# Patient Record
Sex: Male | Born: 1960 | Race: White | Hispanic: No | Marital: Married | Smoking: Never smoker
Health system: Southern US, Community
[De-identification: ages and names within clinical notes are randomized; demographics above are authoritative.]

## PROBLEM LIST (undated history)

## (undated) DIAGNOSIS — J302 Other seasonal allergic rhinitis: Secondary | ICD-10-CM

## (undated) DIAGNOSIS — E785 Hyperlipidemia, unspecified: Secondary | ICD-10-CM

## (undated) HISTORY — DX: Hyperlipidemia, unspecified: E78.5

## (undated) HISTORY — PX: SINUS EXPLORATION: SHX5214

## (undated) HISTORY — PX: TONSILLECTOMY: SUR1361

## (undated) HISTORY — DX: Other seasonal allergic rhinitis: J30.2

## (undated) HISTORY — PX: OTHER SURGICAL HISTORY: SHX169

---

## 1997-10-19 HISTORY — PX: VASECTOMY: SHX75

## 1999-11-23 ENCOUNTER — Ambulatory Visit (HOSPITAL_COMMUNITY): Admission: RE | Admit: 1999-11-23 | Discharge: 1999-11-23 | Payer: Self-pay | Admitting: *Deleted

## 1999-11-23 ENCOUNTER — Encounter: Payer: Self-pay | Admitting: *Deleted

## 1999-11-27 ENCOUNTER — Encounter: Payer: Self-pay | Admitting: *Deleted

## 1999-11-27 ENCOUNTER — Ambulatory Visit (HOSPITAL_COMMUNITY): Admission: RE | Admit: 1999-11-27 | Discharge: 1999-11-27 | Payer: Self-pay | Admitting: *Deleted

## 2010-05-20 ENCOUNTER — Encounter: Admission: RE | Admit: 2010-05-20 | Discharge: 2010-07-17 | Payer: Self-pay | Admitting: Specialist

## 2011-01-07 ENCOUNTER — Encounter: Payer: Self-pay | Admitting: Family Medicine

## 2011-01-07 DIAGNOSIS — E785 Hyperlipidemia, unspecified: Secondary | ICD-10-CM

## 2011-01-07 DIAGNOSIS — E782 Mixed hyperlipidemia: Secondary | ICD-10-CM | POA: Insufficient documentation

## 2011-01-07 DIAGNOSIS — J302 Other seasonal allergic rhinitis: Secondary | ICD-10-CM

## 2013-02-17 ENCOUNTER — Other Ambulatory Visit: Payer: Self-pay

## 2013-03-01 ENCOUNTER — Other Ambulatory Visit (INDEPENDENT_AMBULATORY_CARE_PROVIDER_SITE_OTHER): Payer: 59

## 2013-03-01 DIAGNOSIS — Z0000 Encounter for general adult medical examination without abnormal findings: Secondary | ICD-10-CM

## 2013-03-01 DIAGNOSIS — E785 Hyperlipidemia, unspecified: Secondary | ICD-10-CM

## 2013-03-01 LAB — BASIC METABOLIC PANEL
BUN: 12 mg/dL (ref 6–23)
CO2: 30 mEq/L (ref 19–32)
Calcium: 10.2 mg/dL (ref 8.4–10.5)
Chloride: 105 mEq/L (ref 96–112)
Creat: 0.93 mg/dL (ref 0.50–1.35)
Glucose, Bld: 108 mg/dL — ABNORMAL HIGH (ref 70–99)
Potassium: 5.2 mEq/L (ref 3.5–5.3)
Sodium: 141 mEq/L (ref 135–145)

## 2013-03-01 LAB — POCT CBC
Granulocyte percent: 63.4 %G (ref 37–80)
HCT, POC: 48.4 % (ref 43.5–53.7)
Hemoglobin: 16.5 g/dL (ref 14.1–18.1)
Lymph, poc: 2.5 (ref 0.6–3.4)
MCH, POC: 31.3 pg — AB (ref 27–31.2)
MCHC: 34.1 g/dL (ref 31.8–35.4)
MCV: 91.8 fL (ref 80–97)
MPV: 8.3 fL (ref 0–99.8)
POC Granulocyte: 4.6 (ref 2–6.9)
POC LYMPH PERCENT: 34.5 %L (ref 10–50)
Platelet Count, POC: 194 10*3/uL (ref 142–424)
RBC: 5.3 M/uL (ref 4.69–6.13)
RDW, POC: 12.5 %
WBC: 7.3 10*3/uL (ref 4.6–10.2)

## 2013-03-01 LAB — HEPATIC FUNCTION PANEL
ALT: 31 U/L (ref 0–53)
AST: 22 U/L (ref 0–37)
Albumin: 4.9 g/dL (ref 3.5–5.2)
Alkaline Phosphatase: 108 U/L (ref 39–117)
Bilirubin, Direct: 0.1 mg/dL (ref 0.0–0.3)
Indirect Bilirubin: 0.6 mg/dL (ref 0.0–0.9)
Total Bilirubin: 0.7 mg/dL (ref 0.3–1.2)
Total Protein: 7.5 g/dL (ref 6.0–8.3)

## 2013-03-01 NOTE — Progress Notes (Signed)
Patient here today for labs only. °

## 2013-03-02 LAB — NMR LIPOPROFILE WITH LIPIDS
Cholesterol, Total: 150 mg/dL (ref <200)
HDL Particle Number: 33.9 umol/L (ref >=30.5)
HDL Size: 8.7 nm — ABNORMAL LOW (ref >=9.2)
HDL-C: 39 mg/dL — ABNORMAL LOW (ref >=40)
LDL (calc): 77 mg/dL (ref <100)
LDL Particle Number: 937 nmol/L (ref <1000)
LDL Size: 20 nm — ABNORMAL LOW (ref >20.5)
LP-IR Score: 67 — ABNORMAL HIGH (ref <=45)
Large HDL-P: 3.2 umol/L — ABNORMAL LOW (ref >=4.8)
Large VLDL-P: 4.4 nmol/L — ABNORMAL HIGH (ref <=2.7)
Small LDL Particle Number: 648 nmol/L — ABNORMAL HIGH (ref <=527)
Triglycerides: 168 mg/dL — ABNORMAL HIGH (ref <150)
VLDL Size: 47.6 nm — ABNORMAL HIGH (ref <=46.6)

## 2013-03-02 LAB — VITAMIN D 25 HYDROXY (VIT D DEFICIENCY, FRACTURES): Vit D, 25-Hydroxy: 36 ng/mL (ref 30–89)

## 2013-03-06 ENCOUNTER — Encounter: Payer: Self-pay | Admitting: Family Medicine

## 2013-03-06 ENCOUNTER — Ambulatory Visit (INDEPENDENT_AMBULATORY_CARE_PROVIDER_SITE_OTHER): Payer: 59 | Admitting: Family Medicine

## 2013-03-06 VITALS — BP 111/75 | HR 67 | Temp 97.8°F | Ht 68.0 in | Wt 175.2 lb

## 2013-03-06 DIAGNOSIS — R739 Hyperglycemia, unspecified: Secondary | ICD-10-CM

## 2013-03-06 DIAGNOSIS — R7309 Other abnormal glucose: Secondary | ICD-10-CM

## 2013-03-06 DIAGNOSIS — N400 Enlarged prostate without lower urinary tract symptoms: Secondary | ICD-10-CM

## 2013-03-06 DIAGNOSIS — J309 Allergic rhinitis, unspecified: Secondary | ICD-10-CM

## 2013-03-06 DIAGNOSIS — Z0000 Encounter for general adult medical examination without abnormal findings: Secondary | ICD-10-CM

## 2013-03-06 LAB — POCT GLYCOSYLATED HEMOGLOBIN (HGB A1C): Hemoglobin A1C: 5.4

## 2013-03-06 NOTE — Patient Instructions (Addendum)
Continue more aggressive therapeutic lifestyle changes. This includes weight loss exercise and diet. Check insurance and see if you qualify for a shingles shot or zostavax, they may wait until 52 years old If you continue to have problems with nasal congestion, you may want to try the Nasacort AQ over-the-counter 1-2 sprays each nostril at bedtime Clean the area on your back with alcohol that we did cryotherapy on

## 2013-03-06 NOTE — Progress Notes (Addendum)
  Subjective:    Patient ID: Darrell Schneider, male    DOB: 09/11/1961, 52 y.o.   MRN: [id number]  HPI See present review of systems. Recent labs were reviewed with patient. Blood sugar slightly elevated at 108 hemoglobin A1c was done. A PSA was also drawn today. The main reason for the visit today is his annual physical exam.    Review of Systems  Constitutional: Negative.   HENT: Positive for sneezing and postnasal drip (due to allergies).   Eyes: Negative.   Respiratory: Negative.   Cardiovascular: Negative.   Gastrointestinal: Negative.   Endocrine: Negative.   Genitourinary: Negative.   Musculoskeletal: Positive for back pain (LBP) and arthralgias (R knee, bilateral shoulders and elbows).  Allergic/Immunologic: Positive for environmental allergies (seasonal).  Neurological: Positive for headaches (occasional).  Hematological: Negative.   Psychiatric/Behavioral: Negative.        Objective:   Physical Exam BP 111/75  Pulse 67  Temp(Src) 97.8 F (36.6 C) (Oral)  Ht 5\' 8"  (1.727 m)  Wt 175 lb 3.2 oz (79.47 kg)  BMI 26.65 kg/m2  The patient appeared well nourished and normally developed, alert and oriented to time and place. Speech, behavior and judgement appear normal. Vital signs as documented.  Head exam is unremarkable. No scleral icterus or pallor noted. Nasal congestion bilateral. Throat was normal. Neck is without jugular venous distension, thyromegally, or carotid bruits. Carotid upstrokes are brisk bilaterally. No cervical adenopathy. Lungs are clear anteriorly and posteriorly to auscultation. Normal respiratory effort. Cardiac exam reveals regular rate and rhythm at 72 per minute. First and second heart sounds normal.  No murmurs, rubs or gallops.  Abdominal exam reveals normal bowl sounds, no masses, no organomegaly and no aortic enlargement. No inguinal adenopathy. Rectal exam revealed no rectal masses. Testicular exam was normal. There were no hernias. The prostate  was smooth slightly enlarged without any lumps. Extremities are nonedematous and both femoral and pedal pulses are normal. Skin without pallor or jaundice.  Warm and dry, without rash. Neurologic exam reveals normal deep tendon reflexes and normal sensation.          Assessment & Plan:  1. Elevated blood sugar - POCT glycosylated hemoglobin (Hb A1C)  2. Allergic rhinitis  3. BPH (benign prostatic hyperplasia) - PSA  4.AK - right back/cryotherapy  5. Annual physical exam  Patient Instructions  Continue more aggressive therapeutic lifestyle changes. This includes weight loss exercise and diet. Check insurance and see if you qualify for a shingles shot or zostavax, they may wait until 52 years old If you continue to have problems with nasal congestion, you may want to try the Nasacort AQ over-the-counter 1-2 sprays each nostril at bedtime Clean the area on your back with alcohol that we did cryotherapy on

## 2013-03-08 ENCOUNTER — Telehealth: Payer: Self-pay | Admitting: *Deleted

## 2013-03-08 NOTE — Telephone Encounter (Signed)
Message copied by Bearl Mulberry on Wed Mar 08, 2013  6:02 PM ------      Message from: Ernestina Penna      Created: Tue Mar 07, 2013  7:23 AM       PSA within normal limit ------

## 2013-03-08 NOTE — Telephone Encounter (Signed)
Pt's wife notified of result 

## 2013-03-21 NOTE — Addendum Note (Signed)
Addended by: Ernestina Penna on: 03/21/2013 02:21 PM   Modules accepted: Level of Service

## 2013-03-23 ENCOUNTER — Other Ambulatory Visit (INDEPENDENT_AMBULATORY_CARE_PROVIDER_SITE_OTHER): Payer: 59

## 2013-03-23 DIAGNOSIS — Z1212 Encounter for screening for malignant neoplasm of rectum: Secondary | ICD-10-CM

## 2013-04-06 ENCOUNTER — Other Ambulatory Visit: Payer: Self-pay | Admitting: *Deleted

## 2013-04-06 MED ORDER — ATORVASTATIN CALCIUM 80 MG PO TABS: 80.0000 mg | ORAL_TABLET | Freq: Every day | ORAL | Status: DC

## 2013-04-10 ENCOUNTER — Other Ambulatory Visit: Payer: Self-pay | Admitting: Family Medicine

## 2013-05-15 ENCOUNTER — Other Ambulatory Visit: Payer: Self-pay | Admitting: Family Medicine

## 2013-05-16 NOTE — Telephone Encounter (Signed)
I don't see this on med list or where we have ever ordered [place]

## 2013-05-16 NOTE — Telephone Encounter (Signed)
Please call and confirm this with him Also call the drug store and find out if my name is on this medication

## 2013-05-30 ENCOUNTER — Telehealth: Payer: Self-pay | Admitting: Family Medicine

## 2013-06-05 MED ORDER — ZOLPIDEM TARTRATE 10 MG PO TABS: 10.0000 mg | ORAL_TABLET | Freq: Every evening | ORAL | Status: DC | PRN

## 2013-06-05 NOTE — Telephone Encounter (Signed)
I called Darrell Schneider and he had never filled there before, but Darrell Schneider states that he had gotten Ambien 10 from Darrell Schneider in [place] in the past.  [facility] reports last filling Ambien 10mg  #30 on 05/13/11.

## 2013-06-05 NOTE — Telephone Encounter (Signed)
Being addressed in another encounter

## 2013-06-06 NOTE — Telephone Encounter (Signed)
Spoke with pt regarding Ambien refill He wanted refill called into [facility] called in

## 2013-09-06 ENCOUNTER — Ambulatory Visit (INDEPENDENT_AMBULATORY_CARE_PROVIDER_SITE_OTHER): Payer: 59 | Admitting: Family Medicine

## 2013-09-06 ENCOUNTER — Encounter: Payer: Self-pay | Admitting: Family Medicine

## 2013-09-06 VITALS — BP 119/76 | HR 76 | Temp 96.9°F | Ht 68.0 in | Wt 176.0 lb

## 2013-09-06 DIAGNOSIS — M25512 Pain in left shoulder: Secondary | ICD-10-CM

## 2013-09-06 DIAGNOSIS — E785 Hyperlipidemia, unspecified: Secondary | ICD-10-CM

## 2013-09-06 DIAGNOSIS — E559 Vitamin D deficiency, unspecified: Secondary | ICD-10-CM

## 2013-09-06 DIAGNOSIS — G4700 Insomnia, unspecified: Secondary | ICD-10-CM | POA: Insufficient documentation

## 2013-09-06 DIAGNOSIS — M25519 Pain in unspecified shoulder: Secondary | ICD-10-CM

## 2013-09-06 DIAGNOSIS — J309 Allergic rhinitis, unspecified: Secondary | ICD-10-CM

## 2013-09-06 DIAGNOSIS — J302 Other seasonal allergic rhinitis: Secondary | ICD-10-CM

## 2013-09-06 LAB — POCT CBC
Granulocyte percent: 62.5 %G (ref 37–80)
HCT, POC: 48.5 % (ref 43.5–53.7)
Lymph, poc: 2.6 (ref 0.6–3.4)
MCV: 92.4 fL (ref 80–97)
POC Granulocyte: 4.6 (ref 2–6.9)
Platelet Count, POC: 218 10*3/uL (ref 142–424)
RBC: 5.3 M/uL (ref 4.69–6.13)

## 2013-09-06 MED ORDER — MELOXICAM 15 MG PO TABS: ORAL_TABLET | ORAL | Status: DC

## 2013-09-06 NOTE — Patient Instructions (Addendum)
Continue current medications. Continue good therapeutic lifestyle changes which include good diet and exercise. Fall precautions discussed with patient If you are over 52 years old - you may need Prevnar 13 or the adult Pneumonia vaccine. Be sure to schedule an eye exam Take today's prescribed medication as directed after your largest meal Be sure to check on your insurance for coverage for a Prevnar injection, also check and see if insurance coverage will be provided for a shingles shot Use warm wet compresses to shoulder 20 minutes 3 or 4 times daily

## 2013-09-06 NOTE — Progress Notes (Signed)
Subjective:    Patient ID: Darrell Schneider, male    DOB: Sep 26, 1961, 52 y.o.   MRN: [id number]  HPI Pt here for follow up and management of chronic medical problems. Patient complains of left shoulder pain for a couple of weeks. He does not recall any history of injury to the shoulder. He does have a history of a poor and rotator cuff, right shoulder which has not been operated on. The patient has not had an eye exam he was 52 years old. Health maintenance-wise he is due to get a Prevnar shot.     Patient Active Problem List   Diagnosis Date Noted  . Seasonal allergies 01/07/2011  . Other and unspecified hyperlipidemia    Outpatient Encounter Prescriptions as of 09/06/2013  Medication Sig  . aspirin EC 81 MG tablet Take 81 mg by mouth daily.  Marland Kitchen atorvastatin (LIPITOR) 80 MG tablet Take 1 tablet (80 mg total) by mouth daily.  . Cholecalciferol (VITAMIN D3) 1000 UNITS CAPS Take 1 tablet by mouth 1 dose over 46 hours.    Marland Kitchen zolpidem (AMBIEN) 10 MG tablet Take 1 tablet (10 mg total) by mouth at bedtime as needed for sleep.    Review of Systems  Constitutional: Negative.   HENT: Negative.   Eyes: Negative.   Respiratory: Negative.   Cardiovascular: Negative.   Gastrointestinal: Negative.   Endocrine: Negative.   Genitourinary: Negative.   Musculoskeletal: Positive for arthralgias (left shoulder pain).  Skin: Negative.   Allergic/Immunologic: Negative.   Neurological: Negative.   Hematological: Negative.   Psychiatric/Behavioral: Negative.        Objective:   Physical Exam  Nursing note and vitals reviewed. Constitutional: He is oriented to person, place, and time. He appears well-developed and well-nourished.  HENT:  Head: Normocephalic and atraumatic.  Right Ear: External ear normal.  Left Ear: External ear normal.  Mouth/Throat: Oropharynx is clear and moist. No oropharyngeal exudate.  Nasal congestion bilaterally  Eyes: Conjunctivae and EOM are normal. Pupils are equal,  round, and reactive to light. Right eye exhibits no discharge. Left eye exhibits no discharge. No scleral icterus.  Neck: Normal range of motion. Neck supple. No tracheal deviation present. No thyromegaly present.  No carotid bruits  Cardiovascular: Normal rate, regular rhythm, normal heart sounds and intact distal pulses.  Exam reveals no gallop and no friction rub.   No murmur heard. At 72 per minute  Pulmonary/Chest: Effort normal and breath sounds normal. No respiratory distress. He has no wheezes. He has no rales. He exhibits no tenderness.  Abdominal: Soft. Bowel sounds are normal. He exhibits no mass. There is no tenderness. There is no rebound and no guarding.  Musculoskeletal: Normal range of motion. He exhibits no edema and no tenderness.  Lymphadenopathy:    He has no cervical adenopathy.  Neurological: He is alert and oriented to person, place, and time. He has normal reflexes. No cranial nerve deficit.  Skin: Skin is warm and dry. No rash noted. No erythema. No pallor.  Psychiatric: He has a normal mood and affect. His behavior is normal. Judgment and thought content normal.   BP 119/76  Pulse 76  Temp(Src) 96.9 F (36.1 C) (Oral)  Ht 5\' 8"  (1.727 m)  Wt 176 lb (79.833 kg)  BMI 26.77 kg/m2        Assessment & Plan:   1. Other and unspecified hyperlipidemia   2. Seasonal allergies   3. Vitamin D deficiency   4. Insomnia   5. Left anterior shoulder  pain    Orders Placed This Encounter  Procedures  . Hepatic function panel  . BMP8+EGFR  . NMR, lipoprofile  . Vit D  25 hydroxy (rtn osteoporosis monitoring)  . POCT CBC   Meds ordered this encounter  Medications  . meloxicam (MOBIC) 15 MG tablet    Sig: One half to one daily after eating    Dispense:  30 tablet    Refill:  0   Patient Instructions  Continue current medications. Continue good therapeutic lifestyle changes which include good diet and exercise. Fall precautions discussed with patient If you  are over 53 years old - you may need Prevnar 13 or the adult Pneumonia vaccine. Be sure to schedule an eye exam Take today's prescribed medication as directed after your largest meal Be sure to check on your insurance for coverage for a Prevnar injection, also check and see if insurance coverage will be provided for a shingles shot Use warm wet compresses to shoulder 20 minutes 3 or 4 times daily   Nyra Capes MD

## 2013-09-08 LAB — HEPATIC FUNCTION PANEL
AST: 22 IU/L (ref 0–40)
Albumin: 4.6 g/dL (ref 3.5–5.5)
Total Bilirubin: 0.7 mg/dL (ref 0.0–1.2)
Total Protein: 7.1 g/dL (ref 6.0–8.5)

## 2013-09-08 LAB — NMR, LIPOPROFILE
Cholesterol: 156 mg/dL (ref <200)
HDL Cholesterol by NMR: 43 mg/dL (ref >=40)
LDL Particle Number: 1250 nmol/L — ABNORMAL HIGH (ref <1000)
LDLC SERPL CALC-MCNC: 68 mg/dL (ref <100)
LP-IR Score: 81 — ABNORMAL HIGH (ref <=45)

## 2013-09-08 LAB — BMP8+EGFR
BUN/Creatinine Ratio: 11 (ref 9–20)
CO2: 28 mmol/L (ref 18–29)
Creatinine, Ser: 0.92 mg/dL (ref 0.76–1.27)
GFR calc Af Amer: 110 mL/min/{1.73_m2} (ref >59)
GFR calc non Af Amer: 95 mL/min/{1.73_m2} (ref >59)
Sodium: 140 mmol/L (ref 134–144)

## 2013-09-08 LAB — VITAMIN D 25 HYDROXY (VIT D DEFICIENCY, FRACTURES): Vit D, 25-Hydroxy: 35.2 ng/mL (ref 30.0–100.0)

## 2013-09-20 ENCOUNTER — Telehealth: Payer: Self-pay | Admitting: *Deleted

## 2013-09-20 NOTE — Telephone Encounter (Signed)
Message copied by Baltazar Apo on Wed Sep 20, 2013 11:17 AM ------      Message from: Ernestina Penna      Created: Fri Sep 08, 2013  3:16 PM       LFTs within normal limits      Blood sugar is slightly elevated at 101 this should be less than 100. Kidney function and electrolytes including potassium are within normal limits      On advanced lipid testing a total LDL particle number, which is the most important number, is elevated at 1250. 6 months ago it was 937. This number should be less than 1000. Triglycerides are very elevated at 224. The should be less than 100 ideally. Elevated triglycerides are associated with diet habits that are not healthy---------- try to do better. The HDL particle number or the good cholesterol is in a good range.------ continue current treatment but do better with therapeutic lifestyle changes, which include diet and exercise      Vitamin D is 35.2, increase D. 3 x 1000 daily. This number ideally should be above 50. You can buy the "Now" brand at the drugstore in [place] or at [facility]. ------

## 2013-09-20 NOTE — Telephone Encounter (Signed)
Lm to call back

## 2013-10-02 NOTE — Telephone Encounter (Signed)
Wife notified and verbalized understanding.  

## 2013-12-03 HISTORY — PX: ROTATOR CUFF REPAIR: SHX139

## 2014-05-15 ENCOUNTER — Telehealth: Payer: Self-pay | Admitting: Family Medicine

## 2014-05-16 MED ORDER — ATORVASTATIN CALCIUM 80 MG PO TABS: 80.0000 mg | ORAL_TABLET | Freq: Every day | ORAL | Status: DC

## 2014-05-16 NOTE — Telephone Encounter (Signed)
done

## 2014-10-03 ENCOUNTER — Ambulatory Visit (INDEPENDENT_AMBULATORY_CARE_PROVIDER_SITE_OTHER): Payer: 59 | Admitting: Family Medicine

## 2014-10-03 ENCOUNTER — Encounter: Payer: Self-pay | Admitting: Family Medicine

## 2014-10-03 VITALS — BP 119/78 | HR 68 | Temp 97.6°F | Ht 68.0 in | Wt 178.0 lb

## 2014-10-03 DIAGNOSIS — E559 Vitamin D deficiency, unspecified: Secondary | ICD-10-CM

## 2014-10-03 DIAGNOSIS — Z0000 Encounter for general adult medical examination without abnormal findings: Secondary | ICD-10-CM

## 2014-10-03 DIAGNOSIS — E785 Hyperlipidemia, unspecified: Secondary | ICD-10-CM

## 2014-10-03 DIAGNOSIS — N400 Enlarged prostate without lower urinary tract symptoms: Secondary | ICD-10-CM

## 2014-10-03 DIAGNOSIS — R7309 Other abnormal glucose: Secondary | ICD-10-CM

## 2014-10-03 DIAGNOSIS — R739 Hyperglycemia, unspecified: Secondary | ICD-10-CM

## 2014-10-03 LAB — POCT URINALYSIS DIPSTICK
Bilirubin, UA: NEGATIVE
Glucose, UA: NEGATIVE
KETONES UA: NEGATIVE
LEUKOCYTES UA: NEGATIVE
Nitrite, UA: NEGATIVE
PH UA: 6.5
Protein, UA: NEGATIVE
RBC UA: NEGATIVE
Spec Grav, UA: 1.01
Urobilinogen, UA: NEGATIVE

## 2014-10-03 LAB — POCT UA - MICROSCOPIC ONLY
Bacteria, U Microscopic: NEGATIVE
CRYSTALS, UR, HPF, POC: NEGATIVE
Casts, Ur, LPF, POC: NEGATIVE
MUCUS UA: NEGATIVE
RBC, URINE, MICROSCOPIC: NEGATIVE
WBC, UR, HPF, POC: NEGATIVE
Yeast, UA: NEGATIVE

## 2014-10-03 LAB — POCT CBC
GRANULOCYTE PERCENT: 64 % (ref 37–80)
HEMATOCRIT: 48.1 % (ref 43.5–53.7)
HEMOGLOBIN: 16.1 g/dL (ref 14.1–18.1)
Lymph, poc: 2.3 (ref 0.6–3.4)
MCH, POC: 31.3 pg — AB (ref 27–31.2)
MCHC: 33.4 g/dL (ref 31.8–35.4)
MCV: 93.6 fL (ref 80–97)
MPV: 8.1 fL (ref 0–99.8)
POC Granulocyte: 4.3 (ref 2–6.9)
POC LYMPH PERCENT: 34.1 %L (ref 10–50)
Platelet Count, POC: 225 10*3/uL (ref 142–424)
RBC: 5.1 M/uL (ref 4.69–6.13)
RDW, POC: 12.4 %
WBC: 6.7 10*3/uL (ref 4.6–10.2)

## 2014-10-03 MED ORDER — ATORVASTATIN CALCIUM 80 MG PO TABS: 80.0000 mg | ORAL_TABLET | Freq: Every day | ORAL | Status: DC

## 2014-10-03 MED ORDER — ZOLPIDEM TARTRATE 10 MG PO TABS: 10.0000 mg | ORAL_TABLET | Freq: Every evening | ORAL | Status: DC | PRN

## 2014-10-03 NOTE — Progress Notes (Signed)
Subjective:    Patient ID: Darrell Schneider, male    DOB: 1961-06-01, 53 y.o.   MRN: [id number]  HPI Patient is here today for annual wellness exam and follow up of chronic medical problems. The patient is doing well with no specific complaints. He is due to get a PSA today lab work today and a urinalysis today. He is requesting refills on his atorvastatin and Ambien.         Patient Active Problem List   Diagnosis Date Noted  . Insomnia 09/06/2013  . Seasonal allergies 01/07/2011  . Hyperlipidemia    Outpatient Encounter Prescriptions as of 10/03/2014  Medication Sig  . aspirin EC 81 MG tablet Take 81 mg by mouth daily.  Marland Kitchen atorvastatin (LIPITOR) 80 MG tablet Take 1 tablet (80 mg total) by mouth daily.  . Cholecalciferol (VITAMIN D3) 1000 UNITS CAPS Take 1 tablet by mouth 1 dose over 46 hours.    Marland Kitchen zolpidem (AMBIEN) 10 MG tablet Take 1 tablet (10 mg total) by mouth at bedtime as needed for sleep.  . fluticasone (FLONASE) 50 MCG/ACT nasal spray   . [DISCONTINUED] meloxicam (MOBIC) 15 MG tablet One half to one daily after eating    Review of Systems  Constitutional: Negative.   HENT: Negative.   Eyes: Negative.   Respiratory: Negative.   Cardiovascular: Negative.   Gastrointestinal: Negative.   Endocrine: Negative.   Genitourinary: Negative.   Musculoskeletal: Negative.   Skin: Negative.   Allergic/Immunologic: Negative.   Neurological: Negative.   Hematological: Negative.   Psychiatric/Behavioral: Negative.        Objective:   Physical Exam  Constitutional: He is oriented to person, place, and time. He appears well-developed and well-nourished.  HENT:  Head: Normocephalic and atraumatic.  Right Ear: External ear normal.  Left Ear: External ear normal.  Mouth/Throat: Oropharynx is clear and moist. No oropharyngeal exudate.  Nasal congestion and turbinate swelling bilaterally  Eyes: Conjunctivae and EOM are normal. Pupils are equal, round, and reactive to light. Right  eye exhibits no discharge. Left eye exhibits no discharge. No scleral icterus.  Neck: Normal range of motion. Neck supple. No thyromegaly present.  No anterior cervical adenopathy or carotid bruits  Cardiovascular: Normal rate, regular rhythm, normal heart sounds and intact distal pulses.  Exam reveals no gallop and no friction rub.   No murmur heard. At 72/m  Pulmonary/Chest: Effort normal and breath sounds normal. No respiratory distress. He has no wheezes. He has no rales. He exhibits no tenderness.  No axillary adenopathy and no chest Mccaughey masses  Abdominal: Soft. Bowel sounds are normal. He exhibits no mass. There is no tenderness. There is no rebound and no guarding.  Genitourinary: Rectum normal and penis normal.  The prostate is slightly enlarged without lumps or masses. The rectal exam had no masses. The external genitalia were normal. There was no inguinal hernia and there were no inguinal nodes.  Musculoskeletal: Normal range of motion. He exhibits no edema or tenderness.  Lymphadenopathy:    He has no cervical adenopathy.  Neurological: He is alert and oriented to person, place, and time. He has normal reflexes. No cranial nerve deficit.  Skin: Skin is warm and dry. No rash noted. No erythema. No pallor.  Psychiatric: He has a normal mood and affect. His behavior is normal. Judgment and thought content normal.  Nursing note and vitals reviewed.  BP 119/78 mmHg  Pulse 68  Temp(Src) 97.6 F (36.4 C) (Oral)  Ht _0  (1.727 m)  Wt 178 lb (80.74 kg)  BMI 27.07 kg/m2  EKG: Normal EKG       Assessment & Plan:  1. Vitamin D deficiency - POCT CBC - Vit D  25 hydroxy (rtn osteoporosis monitoring)  2. Elevated blood sugar - POCT CBC - BMP8+EGFR  3. Annual physical exam - POCT CBC - POCT UA - Microscopic Only - POCT urinalysis dipstick - BMP8+EGFR - Hepatic function panel - PSA, total and free - NMR, lipoprofile - Vit D  25 hydroxy (rtn osteoporosis monitoring) -  Thyroid Panel With TSH - EKG 12-Lead  4. BPH (benign prostatic hyperplasia) - POCT CBC - POCT UA - Microscopic Only - POCT urinalysis dipstick - PSA, total and free  5. Hyperlipidemia - EKG 12-Lead  Patient Instructions  Continue current medications. Continue good therapeutic lifestyle changes which include good diet and exercise. Fall precautions discussed with patient. If an FOBT was given today- please return it to our front desk. If you are over 10 years old - you may need Prevnar 47 or the adult Pneumonia vaccine.  Flu Shots will be available at our office starting mid- September. Please call and schedule a FLU CLINIC APPOINTMENT.   We will call you with your lab work results once those results are available Please check with your insurance regarding the Prevnar vaccine. We will refill your atorvastatin and Ambien. Continue to watch sodium intake Return the FOBT   Arrie Senate MD

## 2014-10-03 NOTE — Patient Instructions (Addendum)
Continue current medications. Continue good therapeutic lifestyle changes which include good diet and exercise. Fall precautions discussed with patient. If an FOBT was given today- please return it to our front desk. If you are over 53 years old - you may need Prevnar 13 or the adult Pneumonia vaccine.  Flu Shots will be available at our office starting mid- September. Please call and schedule a FLU CLINIC APPOINTMENT.   We will call you with your lab work results once those results are available Please check with your insurance regarding the Prevnar vaccine. We will refill your atorvastatin and Ambien. Continue to watch sodium intake Return the FOBT

## 2014-10-04 ENCOUNTER — Telehealth: Payer: Self-pay | Admitting: Family Medicine

## 2014-10-04 LAB — NMR, LIPOPROFILE
Cholesterol: 176 mg/dL (ref 100–199)
HDL Cholesterol by NMR: 46 mg/dL (ref >39)
HDL PARTICLE NUMBER: 36 umol/L (ref >=30.5)
LDL Particle Number: 1448 nmol/L — ABNORMAL HIGH (ref <1000)
LDL Size: 20.1 nm (ref >20.5)
LDL-C: 96 mg/dL (ref 0–99)
LP-IR Score: 71 — ABNORMAL HIGH (ref <=45)
SMALL LDL PARTICLE NUMBER: 864 nmol/L — AB (ref <=527)
Triglycerides by NMR: 170 mg/dL — ABNORMAL HIGH (ref 0–149)

## 2014-10-04 LAB — PSA, TOTAL AND FREE
PSA FREE: 0.25 ng/mL
PSA, Free Pct: 35.7 %
PSA: 0.7 ng/mL (ref 0.0–4.0)

## 2014-10-04 LAB — BMP8+EGFR
BUN/Creatinine Ratio: 14 (ref 9–20)
BUN: 14 mg/dL (ref 6–24)
CALCIUM: 10.3 mg/dL — AB (ref 8.7–10.2)
CO2: 24 mmol/L (ref 18–29)
Chloride: 102 mmol/L (ref 97–108)
Creatinine, Ser: 0.98 mg/dL (ref 0.76–1.27)
GFR calc Af Amer: 101 mL/min/{1.73_m2} (ref >59)
GFR, EST NON AFRICAN AMERICAN: 88 mL/min/{1.73_m2} (ref >59)
Glucose: 112 mg/dL — ABNORMAL HIGH (ref 65–99)
Potassium: 5.6 mmol/L — ABNORMAL HIGH (ref 3.5–5.2)
SODIUM: 143 mmol/L (ref 134–144)

## 2014-10-04 LAB — THYROID PANEL WITH TSH
FREE THYROXINE INDEX: 2.3 (ref 1.2–4.9)
T3 Uptake Ratio: 28 % (ref 24–39)
T4, Total: 8.2 ug/dL (ref 4.5–12.0)
TSH: 3.82 u[IU]/mL (ref 0.450–4.500)

## 2014-10-04 LAB — HEPATIC FUNCTION PANEL
ALT: 37 IU/L (ref 0–44)
AST: 29 IU/L (ref 0–40)
Albumin: 4.9 g/dL (ref 3.5–5.5)
Alkaline Phosphatase: 124 IU/L — ABNORMAL HIGH (ref 39–117)
Bilirubin, Direct: 0.15 mg/dL (ref 0.00–0.40)
TOTAL PROTEIN: 7.6 g/dL (ref 6.0–8.5)
Total Bilirubin: 0.5 mg/dL (ref 0.0–1.2)

## 2014-10-04 LAB — VITAMIN D 25 HYDROXY (VIT D DEFICIENCY, FRACTURES): Vit D, 25-Hydroxy: 53.4 ng/mL (ref 30.0–100.0)

## 2014-10-04 NOTE — Telephone Encounter (Signed)
-----   Message from Donald W Moore, MD sent at 10/04/2014  8:28 AM EST ----- The blood sugar slightly elevated at 112. The creatinine the most important kidney function test is within normal limits. The electrolytes including potassium have a potassium that is slightly elevated at 5.6 and a serum calcium that is slightly elevated at 10.3.----- please have this patient recheck a BMP in 2 weeks nonfasting. 1 liver function test is slightly elevated and we will continue to monitor this. The PSA is low and within normal limits. Cholesterol numbers with advanced lipid testing are higher than in the past. The LDL particle numbers now 1448. The LDL C is good at 96. The triglycerides remain elevated above 150 at 170.------ when the patient comes by for his repeat BMP please schedule him for a visit with the clinical pharmacist to discuss better cholesterol management++++++ The vitamin D level is good at 53.4, continue current treatment All thyroid function tests are within normal limits 

## 2014-10-08 NOTE — Telephone Encounter (Signed)
Pt aware of results and need for possible pharmacy appointmnent

## 2014-10-08 NOTE — Telephone Encounter (Signed)
-----   Message from Ernestina Pennaonald W Moore, MD sent at 10/04/2014  8:28 AM EST ----- The blood sugar slightly elevated at 112. The creatinine the most important kidney function test is within normal limits. The electrolytes including potassium have a potassium that is slightly elevated at 5.6 and a serum calcium that is slightly elevated at 10.3.----- please have this patient recheck a BMP in 2 weeks nonfasting. 1 liver function test is slightly elevated and we will continue to monitor this. The PSA is low and within normal limits. Cholesterol numbers with advanced lipid testing are higher than in the past. The LDL particle numbers now 1448. The LDL C is good at 96. The triglycerides remain elevated above 150 at 170.------ when the patient comes by for his repeat BMP please schedule him for a visit with the clinical pharmacist to discuss better cholesterol management++++++ The vitamin D level is good at 53.4, continue current treatment All thyroid function tests are within normal limits

## 2014-12-24 ENCOUNTER — Other Ambulatory Visit: Payer: Self-pay | Admitting: Family Medicine

## 2014-12-24 NOTE — Telephone Encounter (Signed)
Called wife

## 2015-05-31 ENCOUNTER — Other Ambulatory Visit: Payer: Self-pay

## 2015-05-31 MED ORDER — ZOLPIDEM TARTRATE 10 MG PO TABS: 10.0000 mg | ORAL_TABLET | Freq: Every evening | ORAL | Status: DC | PRN

## 2015-05-31 NOTE — Telephone Encounter (Signed)
Last seen 10/03/14  DWM  This is for mail order  If approved print

## 2015-10-08 ENCOUNTER — Ambulatory Visit: Payer: 59 | Admitting: Family Medicine

## 2015-11-19 ENCOUNTER — Other Ambulatory Visit: Payer: Self-pay | Admitting: Family Medicine

## 2016-03-04 ENCOUNTER — Other Ambulatory Visit: Payer: 59

## 2016-03-04 DIAGNOSIS — R739 Hyperglycemia, unspecified: Secondary | ICD-10-CM

## 2016-03-04 DIAGNOSIS — N400 Enlarged prostate without lower urinary tract symptoms: Secondary | ICD-10-CM

## 2016-03-04 DIAGNOSIS — Z0000 Encounter for general adult medical examination without abnormal findings: Secondary | ICD-10-CM

## 2016-03-04 DIAGNOSIS — E559 Vitamin D deficiency, unspecified: Secondary | ICD-10-CM

## 2016-03-04 DIAGNOSIS — E785 Hyperlipidemia, unspecified: Secondary | ICD-10-CM

## 2016-03-05 LAB — BMP8+EGFR
BUN/Creatinine Ratio: 14 (ref 9–20)
BUN: 13 mg/dL (ref 6–24)
CO2: 25 mmol/L (ref 18–29)
CREATININE: 0.96 mg/dL (ref 0.76–1.27)
Calcium: 9.7 mg/dL (ref 8.7–10.2)
Chloride: 99 mmol/L (ref 96–106)
GFR calc Af Amer: 103 mL/min/{1.73_m2} (ref >59)
GFR calc non Af Amer: 89 mL/min/{1.73_m2} (ref >59)
Glucose: 93 mg/dL (ref 65–99)
Potassium: 4.4 mmol/L (ref 3.5–5.2)
Sodium: 139 mmol/L (ref 134–144)

## 2016-03-05 LAB — CBC WITH DIFFERENTIAL/PLATELET
BASOS: 1 %
Basophils Absolute: 0 10*3/uL (ref 0.0–0.2)
EOS (ABSOLUTE): 0.1 10*3/uL (ref 0.0–0.4)
Eos: 2 %
HEMOGLOBIN: 16 g/dL (ref 12.6–17.7)
Hematocrit: 45.9 % (ref 37.5–51.0)
IMMATURE GRANS (ABS): 0 10*3/uL (ref 0.0–0.1)
Immature Granulocytes: 0 %
Lymphocytes Absolute: 2.7 10*3/uL (ref 0.7–3.1)
Lymphs: 46 %
MCH: 31.7 pg (ref 26.6–33.0)
MCHC: 34.9 g/dL (ref 31.5–35.7)
MCV: 91 fL (ref 79–97)
Monocytes Absolute: 0.4 10*3/uL (ref 0.1–0.9)
Monocytes: 7 %
NEUTROS ABS: 2.6 10*3/uL (ref 1.4–7.0)
NEUTROS PCT: 44 %
Platelets: 234 10*3/uL (ref 150–379)
RBC: 5.05 x10E6/uL (ref 4.14–5.80)
RDW: 13.4 % (ref 12.3–15.4)
WBC: 5.9 10*3/uL (ref 3.4–10.8)

## 2016-03-05 LAB — NMR, LIPOPROFILE
CHOLESTEROL: 167 mg/dL (ref 100–199)
HDL Cholesterol by NMR: 46 mg/dL (ref >39)
HDL PARTICLE NUMBER: 31.6 umol/L (ref >=30.5)
LDL PARTICLE NUMBER: 1366 nmol/L — AB (ref <1000)
LDL SIZE: 20.1 nm (ref >20.5)
LDL-C: 96 mg/dL (ref 0–99)
LP-IR SCORE: 67 — AB (ref <=45)
Small LDL Particle Number: 820 nmol/L — ABNORMAL HIGH (ref <=527)
TRIGLYCERIDES BY NMR: 125 mg/dL (ref 0–149)

## 2016-03-05 LAB — VITAMIN D 25 HYDROXY (VIT D DEFICIENCY, FRACTURES): Vit D, 25-Hydroxy: 45 ng/mL (ref 30.0–100.0)

## 2016-03-05 LAB — PSA, TOTAL AND FREE
PSA FREE PCT: 32.9 %
PSA, Free: 0.23 ng/mL
Prostate Specific Ag, Serum: 0.7 ng/mL (ref 0.0–4.0)

## 2016-03-05 LAB — HEPATIC FUNCTION PANEL
ALBUMIN: 4.7 g/dL (ref 3.5–5.5)
ALT: 30 IU/L (ref 0–44)
AST: 28 IU/L (ref 0–40)
Alkaline Phosphatase: 117 IU/L (ref 39–117)
BILIRUBIN TOTAL: 0.8 mg/dL (ref 0.0–1.2)
Bilirubin, Direct: 0.19 mg/dL (ref 0.00–0.40)
Total Protein: 7 g/dL (ref 6.0–8.5)

## 2016-03-09 ENCOUNTER — Ambulatory Visit (INDEPENDENT_AMBULATORY_CARE_PROVIDER_SITE_OTHER): Payer: 59

## 2016-03-09 ENCOUNTER — Ambulatory Visit (INDEPENDENT_AMBULATORY_CARE_PROVIDER_SITE_OTHER): Payer: 59 | Admitting: Family Medicine

## 2016-03-09 ENCOUNTER — Encounter: Payer: Self-pay | Admitting: Family Medicine

## 2016-03-09 ENCOUNTER — Ambulatory Visit: Payer: 59

## 2016-03-09 VITALS — BP 120/82 | HR 74 | Temp 98.2°F | Ht 68.0 in | Wt 181.8 lb

## 2016-03-09 DIAGNOSIS — M25562 Pain in left knee: Secondary | ICD-10-CM | POA: Diagnosis not present

## 2016-03-09 DIAGNOSIS — E785 Hyperlipidemia, unspecified: Secondary | ICD-10-CM

## 2016-03-09 DIAGNOSIS — Z0000 Encounter for general adult medical examination without abnormal findings: Secondary | ICD-10-CM

## 2016-03-09 DIAGNOSIS — N5201 Erectile dysfunction due to arterial insufficiency: Secondary | ICD-10-CM

## 2016-03-09 DIAGNOSIS — G4700 Insomnia, unspecified: Secondary | ICD-10-CM

## 2016-03-09 DIAGNOSIS — Z1211 Encounter for screening for malignant neoplasm of colon: Secondary | ICD-10-CM

## 2016-03-09 DIAGNOSIS — N400 Enlarged prostate without lower urinary tract symptoms: Secondary | ICD-10-CM

## 2016-03-09 MED ORDER — SUVOREXANT 20 MG PO TABS: 20.0000 mg | ORAL_TABLET | Freq: Every evening | ORAL | Status: DC | PRN

## 2016-03-09 MED ORDER — SILDENAFIL CITRATE 20 MG PO TABS: ORAL_TABLET | ORAL | Status: DC

## 2016-03-09 MED ORDER — SUVOREXANT 15 MG PO TABS: 15.0000 mg | ORAL_TABLET | Freq: Every evening | ORAL | Status: DC | PRN

## 2016-03-09 MED ORDER — ZOLPIDEM TARTRATE 10 MG PO TABS: 10.0000 mg | ORAL_TABLET | Freq: Every evening | ORAL | Status: DC | PRN

## 2016-03-09 MED ORDER — SUVOREXANT 10 MG PO TABS: 10.0000 mg | ORAL_TABLET | Freq: Every evening | ORAL | Status: DC | PRN

## 2016-03-09 NOTE — Patient Instructions (Signed)
Take Aleve, over-the-counter, 1 twice daily after breakfast and supper. In order not to irritate her stomach you may want to pick up some ranitidine, the equate brand and take one twice daily before breakfast and supper while staying on the Aleve. He should take the Aleve for at least 3-4 weeks to see if this helps inflammation in the knee. If you continue to have problems she should get back in touch with us as we may need to do an injection in the knee or get further x-rays. Increase the atorvastatin to 80 mg on Monday Wednesday and Friday and 40 on all other days Recheck liver function test and cholesterol and 6 months. Don't forget to get your eye exam Continue to watch her diet closely and get as much exercise as possible Take the sildenafil or generic Viagra as directed Take the Belsomra as directed and increase the dose to review findings which dose works best for you and then call us back and we will call in a prescription for this. We will also check and see when you need to have your next colonoscopy, but it is most likely in 10 years which would put you in September 2022. Return the FOBT or the color guard.

## 2016-03-09 NOTE — Progress Notes (Signed)
Subjective:    Patient ID: Darrell Schneider, male    DOB: October 10, 1961, 55 y.o.   MRN: [id number]  HPI  Patient is here today for a complete physical. Patient has had left knee pain for the last 3-4 weeks. It is important to note that he is no longer taking Ambien. He is taking atorvastatin. He is using Flonase for allergic rhinitis. The patient does not recall any specific injury to his knee. He has been playing racquetball since the first the year and did not notice the medial joint lineof the left knee hurting until sometime in April. It has restricted his activity somewhat. He says he has periodic swelling and pain. He takes some ibuprofen and this relieves the discomfort. He denies any chest pain or shortness of breath. He has no trouble swallowing nausea vomiting diarrhea or blood in the stool. He does have occasional heartburn. He's passing his water without problems but does indicate that he has trouble maintaining his erections. He has not seen an eye doctor in over 7 years. He is not sure when he is supposed to get a follow-up colonoscopy and we will check on this for him.  Review of Systems  Constitutional: Negative.   HENT: Negative.   Eyes: Negative.   Respiratory: Negative.   Cardiovascular: Negative.   Gastrointestinal: Negative.   Endocrine: Negative.   Genitourinary: Negative.   Musculoskeletal:       Left knee pain.   Skin: Negative.   Allergic/Immunologic: Negative.   Neurological: Negative.   Hematological: Negative.   Psychiatric/Behavioral: Negative.         Patient Active Problem List   Diagnosis Date Noted  . Insomnia 09/06/2013  . Seasonal allergies 01/07/2011  . Hyperlipidemia    Outpatient Encounter Prescriptions as of 03/09/2016  Medication Sig  . aspirin EC 81 MG tablet Take 81 mg by mouth daily.  Marland Kitchen. atorvastatin (LIPITOR) 80 MG tablet TAKE 1 TABLET (80 MG TOTAL) BY MOUTH DAILY.  Marland Kitchen. Cholecalciferol (VITAMIN D3) 1000 UNITS CAPS Take 1 tablet by mouth 1 dose  over 46 hours.    . fluticasone (FLONASE) 50 MCG/ACT nasal spray   . zolpidem (AMBIEN) 10 MG tablet Take 1 tablet (10 mg total) by mouth at bedtime as needed for sleep.  . [DISCONTINUED] zolpidem (AMBIEN) 10 MG tablet Take 1 tablet (10 mg total) by mouth at bedtime as needed for sleep.   No facility-administered encounter medications on file as of 03/09/2016.       Objective:   Physical Exam  Constitutional: He is oriented to person, place, and time. He appears well-developed and well-nourished. No distress.  HENT:  Head: Normocephalic and atraumatic.  Right Ear: External ear normal.  Left Ear: External ear normal.  Mouth/Throat: Oropharynx is clear and moist. No oropharyngeal exudate.  Nasal congestion and turbinate swelling bilaterally  Eyes: Conjunctivae and EOM are normal. Pupils are equal, round, and reactive to light. Right eye exhibits no discharge. Left eye exhibits no discharge. No scleral icterus.  Neck: Normal range of motion. Neck supple. No thyromegaly present.  No bruits thyromegaly or anterior cervical adenopathy  Cardiovascular: Normal rate, regular rhythm, normal heart sounds and intact distal pulses.   No murmur heard. The heart has a regular rate and rhythm at 72/m  Pulmonary/Chest: Effort normal and breath sounds normal. No respiratory distress. He has no wheezes. He has no rales. He exhibits no tenderness.  No axillary adenopathy and chest was clear anteriorly and posteriorly  Abdominal: Soft. Bowel sounds  are normal. He exhibits no mass. There is no tenderness. There is no rebound and no guarding.  No liver or spleen abnormality no bruits no masses and no inguinal adenopathy  Genitourinary: Rectum normal and penis normal.  The prostate was slightly enlarged but soft and smooth.  Musculoskeletal: Normal range of motion. He exhibits no edema or tenderness.  The left knee had good mobility with flexion and extension and there was minimal if any joint line tenderness  on the medial joint line of this left knee.  Lymphadenopathy:    He has no cervical adenopathy.  Neurological: He is alert and oriented to person, place, and time. He has normal reflexes. No cranial nerve deficit.  Skin: Skin is warm and dry. No rash noted.  Psychiatric: He has a normal mood and affect. His behavior is normal. Judgment and thought content normal.  Nursing note and vitals reviewed.   BP 120/82 mmHg  Pulse 74  Temp(Src) 98.2 F (36.8 C) (Oral)  Ht 5\' 8"  (1.727 m)  Wt 181 lb 12.8 oz (82.464 kg)  BMI 27.65 kg/m2  [facility] reading (PRIMARY) by  Dr. Shawn Stall knee, results pending                                       Assessment & Plan:  1. Special screening for malignant neoplasms, colon - Fecal occult blood, imunochemical; Future  2. Left knee pain -Take Aleve twice daily for 3-4 weeks after breakfast and supper and take ranitidine prior to breakfast and supper to protect stomach from any side effects with the Aleve - DG Knee 1-2 Views Left; Future  3. Hyperlipidemia -The patient will increase his atorvastatin and take 80 mg on Monday Wednesday and Friday and 40 on the other days and will have his LFTs repeated and 6 months.(  4. BPH (benign prostatic hyperplasia) - Urinalysis, Complete  5. Annual physical exam -Return the FOBT -Get eye exam -Try to get more exercise and watch diet even more closely -We will check on the date for the next colonoscopy and let you know if it is sooner than 10 years -Urinalysis today  6. Erectile dysfunction -Sildenafil prescription 20 mg   7. Insomnia -Trial of Belsomra  Meds ordered this encounter  Medications  . zolpidem (AMBIEN) 10 MG tablet    Sig: Take 1 tablet (10 mg total) by mouth at bedtime as needed for sleep.    Dispense:  90 tablet    Refill:  1  . Suvorexant (BELSOMRA) 10 MG TABS    Sig: Take 10 mg by mouth at bedtime as needed.    Dispense:  10 tablet    Refill:  0  . Suvorexant (BELSOMRA) 15 MG TABS     Sig: Take 15 mg by mouth at bedtime as needed.    Dispense:  10 tablet    Refill:  0  . Suvorexant (BELSOMRA) 20 MG TABS    Sig: Take 20 mg by mouth at bedtime as needed.    Dispense:  10 tablet    Refill:  0  . sildenafil (REVATIO) 20 MG tablet    Sig: Take 2-5 tablets as needed for sexual activity    Dispense:  50 tablet    Refill:  0   Patient Instructions  Take Aleve, over-the-counter, 1 twice daily after breakfast and supper. In order not to irritate her stomach you may want to pick  up some ranitidine, the equate brand and take one twice daily before breakfast and supper while staying on the Aleve. He should take the Aleve for at least 3-4 weeks to see if this helps inflammation in the knee. If you continue to have problems she should get back in touch with [place] as we may need to do an injection in the knee or get further x-rays. Increase the atorvastatin to 80 mg on Monday Wednesday and Friday and 40 on all other days Recheck liver function test and cholesterol and 6 months. Don't forget to get your eye exam Continue to watch her diet closely and get as much exercise as possible Take the sildenafil or generic Viagra as directed Take the Belsomra as directed and increase the dose to review findings which dose works best for you and then call us back and we will call in a prescription for this. We will also check and see when you need to have your next colonoscopy, but it is most likely in 10 years which would put you in September 2022. Return the FOBT or the color guard.   Nyra Capes MD

## 2016-03-18 LAB — COLOGUARD: COLOGUARD: NEGATIVE

## 2016-03-23 ENCOUNTER — Encounter: Payer: Self-pay | Admitting: *Deleted

## 2016-04-02 ENCOUNTER — Encounter: Payer: Self-pay | Admitting: *Deleted

## 2016-05-06 ENCOUNTER — Other Ambulatory Visit: Payer: Self-pay | Admitting: Family Medicine

## 2016-09-08 ENCOUNTER — Ambulatory Visit (INDEPENDENT_AMBULATORY_CARE_PROVIDER_SITE_OTHER): Payer: 59 | Admitting: Family Medicine

## 2016-09-08 ENCOUNTER — Encounter: Payer: Self-pay | Admitting: Family Medicine

## 2016-09-08 VITALS — BP 122/80 | HR 72 | Temp 97.8°F | Ht 68.0 in | Wt 184.0 lb

## 2016-09-08 DIAGNOSIS — E7800 Pure hypercholesterolemia, unspecified: Secondary | ICD-10-CM

## 2016-09-08 DIAGNOSIS — N5201 Erectile dysfunction due to arterial insufficiency: Secondary | ICD-10-CM | POA: Diagnosis not present

## 2016-09-08 DIAGNOSIS — E559 Vitamin D deficiency, unspecified: Secondary | ICD-10-CM | POA: Diagnosis not present

## 2016-09-08 DIAGNOSIS — N400 Enlarged prostate without lower urinary tract symptoms: Secondary | ICD-10-CM

## 2016-09-08 NOTE — Progress Notes (Signed)
Subjective:    Patient ID: Darrell Schneider, male    DOB: 24-Dec-1960, 55 y.o.   MRN: [id number]  HPI Pt here for follow up and management of chronic medical problems which includes hyperlipidemia. He is taking medications regularly.The patient is doing well today with no specific complaints and requiring no refills. His initial blood pressure on the diastolics I was elevated at 94. He is due to get a chest x-ray but wants to wait on this. The patient is doing well overall. Both of his parents are still living and his father has some mild dementia. The patient denies any chest pain pressure or palpitations. He denies any shortness of breath. He has occasional heartburn but other than that has no trouble with his GI tract including nausea vomiting diarrhea blood in the stool or black tarry bowel movements. His last colonoscopy was in September 2012. He does have a maternal uncle that had a history of colon cancer. He is passing his water without problems. He does not check his blood pressures regularly at home but mostly time he says they're in the upper 100 teens and in the 44s.    Patient Active Problem List   Diagnosis Date Noted  . Insomnia 09/06/2013  . Seasonal allergies 01/07/2011  . Hyperlipidemia    Outpatient Encounter Prescriptions as of 09/08/2016  Medication Sig  . aspirin EC 81 MG tablet Take 81 mg by mouth daily.  Marland Kitchen atorvastatin (LIPITOR) 80 MG tablet TAKE 1 TABLET (80 MG TOTAL) BY MOUTH DAILY.  Marland Kitchen Cholecalciferol (VITAMIN D3) 1000 UNITS CAPS Take 1 tablet by mouth 1 dose over 46 hours.    . fluticasone (FLONASE) 50 MCG/ACT nasal spray   . sildenafil (REVATIO) 20 MG tablet Take 2-5 tablets as needed for sexual activity  . Suvorexant (BELSOMRA) 10 MG TABS Take 10 mg by mouth at bedtime as needed.  . [DISCONTINUED] zolpidem (AMBIEN) 10 MG tablet Take 1 tablet (10 mg total) by mouth at bedtime as needed for sleep.  . [DISCONTINUED] Suvorexant (BELSOMRA) 15 MG TABS Take 15 mg by mouth at  bedtime as needed.  . [DISCONTINUED] Suvorexant (BELSOMRA) 20 MG TABS Take 20 mg by mouth at bedtime as needed.   No facility-administered encounter medications on file as of 09/08/2016.       Review of Systems  Constitutional: Negative.   HENT: Negative.   Eyes: Negative.   Respiratory: Negative.   Cardiovascular: Negative.   Gastrointestinal: Negative.   Endocrine: Negative.   Genitourinary: Negative.   Musculoskeletal: Negative.   Skin: Negative.   Allergic/Immunologic: Negative.   Neurological: Negative.   Hematological: Negative.   Psychiatric/Behavioral: Negative.        Objective:   Physical Exam  Constitutional: He is oriented to person, place, and time. He appears well-developed and well-nourished.  Nasal congestion bilaterally  HENT:  Head: Normocephalic and atraumatic.  Right Ear: External ear normal.  Left Ear: External ear normal.  Mouth/Throat: Oropharynx is clear and moist. No oropharyngeal exudate.  Nasal congestion bilaterally  Eyes: Conjunctivae and EOM are normal. Pupils are equal, round, and reactive to light. Right eye exhibits no discharge. Left eye exhibits no discharge. No scleral icterus.  Neck: Normal range of motion. Neck supple. No thyromegaly present.  No bruits thyromegaly or anterior cervical adenopathy  Cardiovascular: Normal rate, regular rhythm, normal heart sounds and intact distal pulses.   No murmur heard. The heart has a regular rate and rhythm at 72/m  Pulmonary/Chest: Effort normal and breath sounds normal.  No respiratory distress. He has no wheezes. He has no rales. He exhibits no tenderness.  Clear anteriorly and posteriorly and no axillary adenopathy  Abdominal: Soft. Bowel sounds are normal. He exhibits no mass. There is no tenderness. There is no rebound and no guarding.  No liver or spleen enlargement no epigastric tenderness and no inguinal adenopathy and no bruits  Musculoskeletal: Normal range of motion. He exhibits no  edema.  Lymphadenopathy:    He has no cervical adenopathy.  Neurological: He is alert and oriented to person, place, and time. He has normal reflexes. No cranial nerve deficit.  Skin: Skin is warm and dry. No rash noted.  Psychiatric: He has a normal mood and affect. His behavior is normal. Judgment and thought content normal.  Nursing note and vitals reviewed.  BP (!) 136/94 (BP Location: Left Arm)   Pulse 72   Temp 97.8 F (36.6 C) (Oral)   Ht '5\' 8"'$  (1.727 m)   Wt 184 lb (83.5 kg)   BMI 27.98 kg/m         Assessment & Plan:  1. Pure hypercholesterolemia -Continue with atorvastatin and aggressive therapeutic lifestyle changes pending results of lab work. Patient questions if Crestor might be better and I told him we would wait until we get the results back from his current treatment before we would consider changing. - BMP8+EGFR - CBC with Differential/Platelet - Hepatic function panel - NMR, lipoprofile  2. Benign prostatic hyperplasia, unspecified whether lower urinary tract symptoms present -No complaints with this today. - CBC with Differential/Platelet  3. Vitamin D deficiency -Continue with current treatment pending results of lab work - CBC with Differential/Platelet - VITAMIN D 25 Hydroxy (Vit-D Deficiency, Fractures)  4. Erectile dysfunction due to arterial insufficiency -Continue with current treatment, weight loss and healthy eating habits  No orders of the defined types were placed in this encounter.  Patient Instructions  Continue current medications. Continue good therapeutic lifestyle changes which include good diet and exercise. Fall precautions discussed with patient. If an FOBT was given today- please return it to our front desk. If you are over 43 years old - you may need Prevnar 46 or the adult Pneumonia vaccine.  **Flu shots are available--- please call and schedule a FLU-CLINIC appointment**  After your visit with [place] today you will receive a  survey in the mail or online from [employer] regarding your care with [place]. Please take a moment to fill this out. Your feedback is very important to [place] as you can help [place] better understand your patient needs as well as improve your experience and satisfaction. WE CARE ABOUT YOU!!!   Continue to follow-up aggressive therapeutic lifestyle changes Continue current atorvastatin pending results of lab work Use nasal saline in addition to Flonase and avoid irritating environments as much as possible This winter drink plenty of fluids and keep the house as cool as possible The patient should go get a good eye exam   Arrie Senate MD

## 2016-09-08 NOTE — Patient Instructions (Addendum)
Continue current medications. Continue good therapeutic lifestyle changes which include good diet and exercise. Fall precautions discussed with patient. If an FOBT was given today- please return it to our front desk. If you are over 55 years old - you may need Prevnar 13 or the adult Pneumonia vaccine.  **Flu shots are available--- please call and schedule a FLU-CLINIC appointment**  After your visit with us today you will receive a survey in the mail or online from [employer] regarding your care with us. Please take a moment to fill this out. Your feedback is very important to us as you can help us better understand your patient needs as well as improve your experience and satisfaction. WE CARE ABOUT YOU!!!   Continue to follow-up aggressive therapeutic lifestyle changes Continue current atorvastatin pending results of lab work Use nasal saline in addition to Flonase and avoid irritating environments as much as possible This winter drink plenty of fluids and keep the house as cool as possible The patient should go get a good eye exam

## 2016-09-09 ENCOUNTER — Ambulatory Visit: Payer: 59 | Admitting: Family Medicine

## 2016-09-09 LAB — BMP8+EGFR
BUN/Creatinine Ratio: 14 (ref 9–20)
BUN: 13 mg/dL (ref 6–24)
CALCIUM: 9.7 mg/dL (ref 8.7–10.2)
CHLORIDE: 101 mmol/L (ref 96–106)
CO2: 26 mmol/L (ref 18–29)
CREATININE: 0.93 mg/dL (ref 0.76–1.27)
GFR, EST AFRICAN AMERICAN: 106 mL/min/{1.73_m2} (ref >59)
GFR, EST NON AFRICAN AMERICAN: 92 mL/min/{1.73_m2} (ref >59)
Glucose: 112 mg/dL — ABNORMAL HIGH (ref 65–99)
Potassium: 4.9 mmol/L (ref 3.5–5.2)
Sodium: 145 mmol/L — ABNORMAL HIGH (ref 134–144)

## 2016-09-09 LAB — NMR, LIPOPROFILE
CHOLESTEROL: 184 mg/dL (ref 100–199)
HDL Cholesterol by NMR: 44 mg/dL (ref >39)
HDL Particle Number: 32.2 umol/L (ref >=30.5)
LDL Particle Number: 1223 nmol/L — ABNORMAL HIGH (ref <1000)
LDL SIZE: 19.6 nm (ref >20.5)
LDL-C: 101 mg/dL — ABNORMAL HIGH (ref 0–99)
LP-IR Score: 62 — ABNORMAL HIGH (ref <=45)
SMALL LDL PARTICLE NUMBER: 1039 nmol/L — AB (ref <=527)
TRIGLYCERIDES BY NMR: 196 mg/dL — AB (ref 0–149)

## 2016-09-09 LAB — CBC WITH DIFFERENTIAL/PLATELET
BASOS ABS: 0 10*3/uL (ref 0.0–0.2)
Basos: 0 %
EOS (ABSOLUTE): 0.1 10*3/uL (ref 0.0–0.4)
EOS: 2 %
HEMATOCRIT: 44.2 % (ref 37.5–51.0)
HEMOGLOBIN: 15.8 g/dL (ref 12.6–17.7)
IMMATURE GRANS (ABS): 0 10*3/uL (ref 0.0–0.1)
Immature Granulocytes: 0 %
LYMPHS: 34 %
Lymphocytes Absolute: 2.3 10*3/uL (ref 0.7–3.1)
MCH: 32.4 pg (ref 26.6–33.0)
MCHC: 35.7 g/dL (ref 31.5–35.7)
MCV: 91 fL (ref 79–97)
MONOCYTES: 8 %
Monocytes Absolute: 0.5 10*3/uL (ref 0.1–0.9)
NEUTROS ABS: 3.8 10*3/uL (ref 1.4–7.0)
Neutrophils: 56 %
Platelets: 239 10*3/uL (ref 150–379)
RBC: 4.88 x10E6/uL (ref 4.14–5.80)
RDW: 13.2 % (ref 12.3–15.4)
WBC: 6.7 10*3/uL (ref 3.4–10.8)

## 2016-09-09 LAB — HEPATIC FUNCTION PANEL
ALBUMIN: 4.7 g/dL (ref 3.5–5.5)
ALK PHOS: 129 IU/L — AB (ref 39–117)
ALT: 32 IU/L (ref 0–44)
AST: 24 IU/L (ref 0–40)
BILIRUBIN, DIRECT: 0.18 mg/dL (ref 0.00–0.40)
Bilirubin Total: 0.8 mg/dL (ref 0.0–1.2)
TOTAL PROTEIN: 7.5 g/dL (ref 6.0–8.5)

## 2016-09-09 LAB — VITAMIN D 25 HYDROXY (VIT D DEFICIENCY, FRACTURES): VIT D 25 HYDROXY: 47.6 ng/mL (ref 30.0–100.0)

## 2016-09-18 ENCOUNTER — Telehealth: Payer: Self-pay | Admitting: Family Medicine

## 2016-09-18 ENCOUNTER — Other Ambulatory Visit: Payer: Self-pay | Admitting: *Deleted

## 2016-09-18 MED ORDER — ROSUVASTATIN CALCIUM 20 MG PO TABS: 20.0000 mg | ORAL_TABLET | Freq: Every day | ORAL | 1 refills | Status: DC

## 2016-09-18 NOTE — Telephone Encounter (Signed)
Generic Crestor is fine, I thought he was already taking that. Please let the pharmacist know we want the generic.

## 2016-09-18 NOTE — Telephone Encounter (Signed)
Patient wants to switch to generic Crestor.  It will be cheaper and you told him it would be fine to switch after you reviewed his last lipids. Please send new script to [facility].

## 2016-09-18 NOTE — Telephone Encounter (Signed)
Aware.  Script for generic crestor sent to pharmacy.

## 2016-11-30 ENCOUNTER — Encounter: Payer: Self-pay | Admitting: *Deleted

## 2017-01-05 ENCOUNTER — Other Ambulatory Visit: Payer: Self-pay | Admitting: Family

## 2017-03-05 ENCOUNTER — Other Ambulatory Visit: Payer: 59

## 2017-03-05 DIAGNOSIS — N400 Enlarged prostate without lower urinary tract symptoms: Secondary | ICD-10-CM

## 2017-03-05 DIAGNOSIS — E7800 Pure hypercholesterolemia, unspecified: Secondary | ICD-10-CM

## 2017-03-05 DIAGNOSIS — E559 Vitamin D deficiency, unspecified: Secondary | ICD-10-CM

## 2017-03-05 DIAGNOSIS — Z0000 Encounter for general adult medical examination without abnormal findings: Secondary | ICD-10-CM

## 2017-03-06 LAB — VITAMIN D 25 HYDROXY (VIT D DEFICIENCY, FRACTURES): Vit D, 25-Hydroxy: 33.1 ng/mL (ref 30.0–100.0)

## 2017-03-06 LAB — BMP8+EGFR
BUN/Creatinine Ratio: 19 (ref 9–20)
BUN: 16 mg/dL (ref 6–24)
CO2: 26 mmol/L (ref 18–29)
Calcium: 9.5 mg/dL (ref 8.7–10.2)
Chloride: 97 mmol/L (ref 96–106)
Creatinine, Ser: 0.85 mg/dL (ref 0.76–1.27)
GFR calc Af Amer: 113 mL/min/{1.73_m2} (ref >59)
GFR, EST NON AFRICAN AMERICAN: 97 mL/min/{1.73_m2} (ref >59)
Glucose: 100 mg/dL — ABNORMAL HIGH (ref 65–99)
POTASSIUM: 4.3 mmol/L (ref 3.5–5.2)
SODIUM: 139 mmol/L (ref 134–144)

## 2017-03-06 LAB — PSA, TOTAL AND FREE
PSA FREE PCT: 30 %
PSA FREE: 0.21 ng/mL
Prostate Specific Ag, Serum: 0.7 ng/mL (ref 0.0–4.0)

## 2017-03-06 LAB — HEPATIC FUNCTION PANEL
ALT: 29 IU/L (ref 0–44)
AST: 23 IU/L (ref 0–40)
Albumin: 4.4 g/dL (ref 3.5–5.5)
Alkaline Phosphatase: 108 IU/L (ref 39–117)
Bilirubin Total: 0.5 mg/dL (ref 0.0–1.2)
Bilirubin, Direct: 0.12 mg/dL (ref 0.00–0.40)
Total Protein: 7.5 g/dL (ref 6.0–8.5)

## 2017-03-06 LAB — CBC WITH DIFFERENTIAL/PLATELET
Basophils Absolute: 0 10*3/uL (ref 0.0–0.2)
Basos: 0 %
EOS (ABSOLUTE): 0.1 10*3/uL (ref 0.0–0.4)
EOS: 1 %
HEMATOCRIT: 47 % (ref 37.5–51.0)
Hemoglobin: 15.6 g/dL (ref 13.0–17.7)
Immature Grans (Abs): 0 10*3/uL (ref 0.0–0.1)
Immature Granulocytes: 0 %
LYMPHS ABS: 2.9 10*3/uL (ref 0.7–3.1)
Lymphs: 39 %
MCH: 31.1 pg (ref 26.6–33.0)
MCHC: 33.2 g/dL (ref 31.5–35.7)
MCV: 94 fL (ref 79–97)
MONOS ABS: 0.5 10*3/uL (ref 0.1–0.9)
Monocytes: 7 %
Neutrophils Absolute: 3.8 10*3/uL (ref 1.4–7.0)
Neutrophils: 53 %
Platelets: 218 10*3/uL (ref 150–379)
RBC: 5.01 x10E6/uL (ref 4.14–5.80)
RDW: 13.8 % (ref 12.3–15.4)
WBC: 7.4 10*3/uL (ref 3.4–10.8)

## 2017-03-06 LAB — LIPID PANEL
CHOL/HDL RATIO: 4 ratio (ref 0.0–5.0)
Cholesterol, Total: 181 mg/dL (ref 100–199)
HDL: 45 mg/dL (ref >39)
LDL Calculated: 95 mg/dL (ref 0–99)
Triglycerides: 203 mg/dL — ABNORMAL HIGH (ref 0–149)
VLDL Cholesterol Cal: 41 mg/dL — ABNORMAL HIGH (ref 5–40)

## 2017-03-08 ENCOUNTER — Encounter: Payer: Self-pay | Admitting: Family Medicine

## 2017-03-08 ENCOUNTER — Ambulatory Visit (INDEPENDENT_AMBULATORY_CARE_PROVIDER_SITE_OTHER): Payer: 59

## 2017-03-08 ENCOUNTER — Ambulatory Visit (INDEPENDENT_AMBULATORY_CARE_PROVIDER_SITE_OTHER): Payer: 59 | Admitting: Family Medicine

## 2017-03-08 VITALS — BP 112/71 | HR 75 | Temp 97.3°F | Ht 68.0 in | Wt 184.0 lb

## 2017-03-08 DIAGNOSIS — M7712 Lateral epicondylitis, left elbow: Secondary | ICD-10-CM | POA: Diagnosis not present

## 2017-03-08 DIAGNOSIS — E7800 Pure hypercholesterolemia, unspecified: Secondary | ICD-10-CM

## 2017-03-08 DIAGNOSIS — Z0000 Encounter for general adult medical examination without abnormal findings: Secondary | ICD-10-CM | POA: Diagnosis not present

## 2017-03-08 DIAGNOSIS — E559 Vitamin D deficiency, unspecified: Secondary | ICD-10-CM

## 2017-03-08 DIAGNOSIS — N400 Enlarged prostate without lower urinary tract symptoms: Secondary | ICD-10-CM

## 2017-03-08 DIAGNOSIS — E781 Pure hyperglyceridemia: Secondary | ICD-10-CM

## 2017-03-08 LAB — URINALYSIS, COMPLETE
Bilirubin, UA: NEGATIVE
GLUCOSE, UA: NEGATIVE
Ketones, UA: NEGATIVE
LEUKOCYTES UA: NEGATIVE
Nitrite, UA: NEGATIVE
PROTEIN UA: NEGATIVE
RBC UA: NEGATIVE
Specific Gravity, UA: 1.02 (ref 1.005–1.030)
UUROB: 0.2 mg/dL (ref 0.2–1.0)
pH, UA: 7 (ref 5.0–7.5)

## 2017-03-08 LAB — MICROSCOPIC EXAMINATION
Bacteria, UA: NONE SEEN
Epithelial Cells (non renal): NONE SEEN /hpf (ref 0–10)
RBC, UA: NONE SEEN /hpf (ref 0–?)
RENAL EPITHEL UA: NONE SEEN /HPF

## 2017-03-08 MED ORDER — ICOSAPENT ETHYL 1 G PO CAPS: 1.0000 | ORAL_CAPSULE | Freq: Four times a day (QID) | ORAL | 3 refills | Status: DC

## 2017-03-08 MED ORDER — AZELASTINE HCL 0.1 % NA SOLN: 1.0000 | Freq: Every day | NASAL | 6 refills | Status: DC

## 2017-03-08 NOTE — Progress Notes (Signed)
Subjective:    Patient ID: Darrell Schneider, male    DOB: Apr 27, 1961, 56 y.o.   MRN: [id number]  HPI Patient is here today for annual wellness exam and follow up of chronic medical problems which includes hyperlipidemia. He is taking medication regularly.The patient has had lab work done recently and this will be reviewed with him during the visit today. All of his liver function tests were normal. The CBC had a normal white blood cell count with a good hemoglobin at 15.6 and an adequate platelet count. The blood sugar was minimally elevated at 100 this time. The creatinine, the most important kidney function test was normal and all the electrolytes were good including the sodium. Cholesterol numbers with traditional lipid testing have a total cholesterol that was good at 181 and the triglycerides were elevated at 203. The LDL C cholesterol was 95. The previous triglycerides were 168. The PSA remains low and normal. The vitamin D level was good at 33.1 but we would ask him to either increase his vitamin D3 are take what he has been taking on a more regular basis. 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 He likes is much better. He does complain of some left elbow pain that was like a left lateral epicondylitis and he is not sure how this got started again. He denies any chest pain or shortness of breath. He denies any problems with his stomach including swallowing heartburn indigestion nausea vomiting diarrhea or blood in his stool. He denies any black tarry bowel movements. He has no direct family members that have had colon cancer. He has one uncle on his mother's side has had colon cancer. He is passing his water without problems. He is requesting refills on all of his medicines. After reviewing his lab work he does understand to increase his vitamin D back up to 2000 units daily. We will also add vascepa if possible to his current Crestor medication to get  better control of the triglycerides.       Review of Systems  Constitutional: Negative.   HENT: Negative.   Eyes: Negative.   Respiratory: Negative.   Cardiovascular: Negative.   Gastrointestinal: Negative.   Endocrine: Negative.   Genitourinary: Negative.   Musculoskeletal: Positive for arthralgias (left elbow pain).  Skin: Negative.   Allergic/Immunologic: Negative.   Neurological: Negative.   Hematological: Negative.   Psychiatric/Behavioral: Negative.        Objective:   Physical Exam  Constitutional: He is oriented to person, place, and time. He appears well-developed and well-nourished. No distress.  HENT:  Head: Normocephalic and atraumatic.  Right Ear: External ear normal.  Left Ear: External ear normal.  Mouth/Throat: Oropharynx is clear and moist. No oropharyngeal exudate.  Nasal congestion and turbinate swelling bilaterally  Eyes: Conjunctivae and EOM are normal. Pupils are equal, round, and reactive to light. Right eye exhibits no discharge. Left eye exhibits no discharge. No scleral icterus.  Neck: Normal range of motion. Neck supple. No thyromegaly present.  No bruits thyromegaly or anterior cervical adenopathy  Cardiovascular: Normal rate, regular rhythm, normal heart sounds and intact distal pulses.   No murmur heard. The heart has a regular rate and rhythm at 72/m  Pulmonary/Chest: Effort normal and breath sounds normal. No respiratory distress. He has no wheezes. He has no rales. He exhibits no tenderness.  Clear anteriorly and posteriorly no axillary adenopathy  Abdominal: Soft. Bowel sounds are normal. He exhibits no mass.  There is no tenderness. There is no rebound and no guarding.  No abdominal tenderness or organ enlargement bruits or inguinal adenopathy  Genitourinary: Rectum normal and penis normal.  Genitourinary Comments: The prostate is enlarged but soft and smooth. There were no rectal masses. External genitalia were within normal limits and  no inguinal hernias were palpated.  Musculoskeletal: Normal range of motion. He exhibits tenderness. He exhibits no edema.  There was tenderness over the left lateral epicondyles.  Lymphadenopathy:    He has no cervical adenopathy.  Neurological: He is alert and oriented to person, place, and time. He has normal reflexes. No cranial nerve deficit.  Skin: Skin is warm and dry. No rash noted.  Psychiatric: He has a normal mood and affect. His behavior is normal. Judgment and thought content normal.  Nursing note and vitals reviewed.  BP 112/71 (BP Location: Left Arm)   Pulse 75   Temp 97.3 F (36.3 C) (Oral)   Ht 5\' 8"  (1.727 m)   Wt 184 lb (83.5 kg)   BMI 27.98 kg/m    EKG and chest x-ray with results pending====     Assessment & Plan:  1. Annual physical exam -The patient's triglyceride remains elevated and we will add and omega-3 fatty acid to his current treatment and ask him to repeat his lipid liver panel in about 3-4 months after starting this. He will continue with his Crestor during this time and with as aggressive therapeutic lifestyle changes as possible - EKG 12-Lead - DG Chest 2 View; Future - Urinalysis, Complete  2. Pure hypercholesterolemia -Crestor is doing well with his elevated cholesterol but his elevated triglycerides are not good and he does not tolerate atorvastatin because of muscle aches and myalgias. We will add omega-3 fatty acids to his current treatment regimen - EKG 12-Lead - DG Chest 2 View; Future  3. Benign prostatic hyperplasia, unspecified whether lower urinary tract symptoms present -No symptoms with passing his water. - Urinalysis, Complete  4. Vitamin D deficiency -Increase vitamin D3 to 2000 units daily.  5. Hypertriglyceridemia -Add omega-3 fatty acids either with Simonne MartinetJR Carlson 1 g twice daily or vascepa 1 g twice daily.  6. Left lateral epicondylitis -Marcaine and steroid 1:1 ratio 1 mL to area of tenderness under sterile conditions.  Patient tolerated the procedure well.  7. Allergic rhinitis -The patient will continue to use Flonase and we will add to this Astelin nasal spray 1-2 sprays each nostril at bedtime, he will try this for one month and get back in touch with us if it is not working.  No orders of the defined types were placed in this encounter.  Patient Instructions  Continue current medications. Continue good therapeutic lifestyle changes which include good diet and exercise. Fall precautions discussed with patient. If an FOBT was given today- please return it to our front desk. If you are over 56 years old - you may need Prevnar 13 or the adult Pneumonia vaccine.  **Flu shots are available--- please call and schedule a FLU-CLINIC appointment**  After your visit with us today you will receive a survey in the mail or online from [employer] regarding your care with us. Please take a moment to fill this out. Your feedback is very important to us as you can help us better understand your patient needs as well as improve your experience and satisfaction. WE CARE ABOUT YOU!!!   The elbow may feel better for a few days and then heard a little bit more  and then get better again after this injection. Please avoid any overuse of this in the next week. Continue with aggressive therapeutic lifestyle changes Add the omega-3 fatty acids as directed and continue to work on weight loss through diet and exercise Also do not forget to get your eye exam. This is very important and have the eye doctor send [place] a copy of that.   Nyra Capes MD

## 2017-03-08 NOTE — Patient Instructions (Addendum)
Continue current medications. Continue good therapeutic lifestyle changes which include good diet and exercise. Fall precautions discussed with patient. If an FOBT was given today- please return it to our front desk. If you are over 730 years old - you may need Prevnar 13 or the adult Pneumonia vaccine.  **Flu shots are available--- please call and schedule a FLU-CLINIC appointment**  After your visit with us today you will receive a survey in the mail or online from [employer] regarding your care with us. Please take a moment to fill this out. Your feedback is very important to us as you can help us better understand your patient needs as well as improve your experience and satisfaction. WE CARE ABOUT YOU!!!   The elbow may feel better for a few days and then heard a little bit more and then get better again after this injection. Please avoid any overuse of this in the next week. Continue with aggressive therapeutic lifestyle changes Add the omega-3 fatty acids as directed and continue to work on weight loss through diet and exercise Also do not forget to get your eye exam. This is very important and have the eye doctor send us a copy of that.

## 2017-03-10 ENCOUNTER — Ambulatory Visit: Payer: 59 | Admitting: Family Medicine

## 2017-03-16 ENCOUNTER — Telehealth: Payer: Self-pay

## 2017-03-16 NOTE — Telephone Encounter (Signed)
Pt aware to try the otc - Darrell Schneider fish oil

## 2017-04-07 ENCOUNTER — Telehealth: Payer: Self-pay | Admitting: Family Medicine

## 2017-04-07 MED ORDER — ROSUVASTATIN CALCIUM 20 MG PO TABS
ORAL_TABLET | ORAL | 3 refills | Status: DC
Start: 2017-04-07 — End: 2018-03-09

## 2017-04-07 NOTE — Telephone Encounter (Signed)
What is the name of the medication? rosuvastatin (CRESTOR) 20 MG tablet  Have you contacted your pharmacy to request a refill? Yes but switching pharmacy  Which pharmacy would you like this sent to? [facility] [phone]   Patient notified that their request is being sent to the clinical staff for review and that they should receive a call once it is complete. If they do not receive a call within 24 hours they can check with their pharmacy or our office.

## 2017-04-07 NOTE — Telephone Encounter (Signed)
LMOVM RF sent to pharmacy

## 2017-08-24 ENCOUNTER — Other Ambulatory Visit: Payer: Self-pay | Admitting: Family Medicine

## 2017-08-24 ENCOUNTER — Encounter: Payer: Self-pay | Admitting: Family Medicine

## 2017-08-24 ENCOUNTER — Ambulatory Visit (INDEPENDENT_AMBULATORY_CARE_PROVIDER_SITE_OTHER): Payer: 59

## 2017-08-24 ENCOUNTER — Ambulatory Visit (INDEPENDENT_AMBULATORY_CARE_PROVIDER_SITE_OTHER): Payer: 59 | Admitting: Family Medicine

## 2017-08-24 VITALS — BP 133/71 | HR 72 | Temp 97.3°F | Ht 68.0 in | Wt 183.0 lb

## 2017-08-24 DIAGNOSIS — M545 Low back pain, unspecified: Secondary | ICD-10-CM

## 2017-08-24 MED ORDER — CYCLOBENZAPRINE HCL 10 MG PO TABS: 10.0000 mg | ORAL_TABLET | Freq: Three times a day (TID) | ORAL | 0 refills | Status: DC | PRN

## 2017-08-24 MED ORDER — BETAMETHASONE SOD PHOS & ACET 6 (3-3) MG/ML IJ SUSP
6.0000 mg | Freq: Once | INTRAMUSCULAR | Status: AC
  Administered 2017-08-24: 6 mg via INTRAMUSCULAR

## 2017-08-24 MED ORDER — DICLOFENAC SODIUM 75 MG PO TBEC: 75.0000 mg | DELAYED_RELEASE_TABLET | Freq: Two times a day (BID) | ORAL | 2 refills | Status: DC

## 2017-08-24 NOTE — Progress Notes (Signed)
Subjective:  Patient ID: Darrell Schneider, male    DOB: 03-Jan-1961  Age: 56 y.o. MRN: [id number]  CC: Back Pain (pt here today c/o lower back pain after playing golf 2 weeks ago and he has tried exercises to help stretch his back but no relief and advil isn't helping.)   HPI Darrell Schneider presents for back pain as noted above.  Midline lower back, just TO the left.  It is worse when he awakens in the morning.  7-8/10.  I see some stretches that his niece who is physical therapist gave him and it will drop to a 4/10.  He has not gotten any relief with over-the-counter analgesics such as Advil.  He has had low back pain past but it usually goes away after a few days to a week.  This is the first time it lasted to 4 weeks.  Additionally continues to not improve.  It is interfering with his activities   Depression screen [facility] 2/9 08/24/2017 03/08/2017 09/08/2016  Decreased Interest 0 0 0  Down, Depressed, Hopeless 0 0 0  PHQ - 2 Score 0 0 0    History Darrell Schneider has a past medical history of Other and unspecified hyperlipidemia and Seasonal allergies.   He has a past surgical history that includes Tonsillectomy; Vasectomy (1999); left great toe spur removal; and Rotator cuff repair (Left, Dec 03 2013).   His family history is not on file.He reports that  has never smoked. he has never used smokeless tobacco. He reports that he drinks alcohol. He reports that he does not use drugs.    ROS Review of Systems  Constitutional: Negative for chills, diaphoresis and fever.  HENT: Negative for sore throat.   Respiratory: Negative for shortness of breath.   Cardiovascular: Negative for chest pain.  Gastrointestinal: Negative for abdominal pain.  Musculoskeletal: Positive for arthralgias, back pain, gait problem and myalgias. Negative for neck pain.  Skin: Negative for rash.  Neurological: Positive for weakness. Negative for numbness.    Objective:  BP 133/71   Pulse 72   Temp (!) 97.3 F (36.3 C) (Oral)    Ht 5\' 8"  (1.727 m)   Wt 183 lb (83 kg)   BMI 27.83 kg/m   BP Readings from Last 3 Encounters:  08/24/17 133/71  03/08/17 112/71  09/08/16 122/80    Wt Readings from Last 3 Encounters:  08/24/17 183 lb (83 kg)  03/08/17 184 lb (83.5 kg)  09/08/16 184 lb (83.5 kg)     Physical Exam  Constitutional: He is oriented to person, place, and time. He appears well-developed and well-nourished. He appears distressed.  HENT:  Head: Normocephalic.  Eyes: Pupils are equal, round, and reactive to light.  Neck: Normal range of motion.  Cardiovascular: Normal rate, regular rhythm and normal heart sounds.  No murmur heard. Pulmonary/Chest: Effort normal and breath sounds normal.  Abdominal: There is no tenderness.  Musculoskeletal: He exhibits tenderness.  Neurological: He is alert and oriented to person, place, and time. He has normal reflexes.  Skin: Skin is warm and dry.  Psychiatric: His behavior is normal. Thought content normal.  Vitals reviewed.     Assessment & Plan:   Darrell Schneider was seen today for back pain.  Diagnoses and all orders for this visit:  Lumbar back pain -     DG Lumbar Spine 2-3 Views; Future -     Ambulatory referral to Physical Therapy -     betamethasone acetate-betamethasone sodium phosphate (CELESTONE) injection 6 mg  Other orders -     diclofenac (VOLTAREN) 75 MG EC tablet; Take 1 tablet (75 mg total) 2 (two) times daily by mouth. -     cyclobenzaprine (FLEXERIL) 10 MG tablet; Take 1 tablet (10 mg total) 3 (three) times daily as needed by mouth for muscle spasms.       I have discontinued [employer] Walston's Suvorexant. I am also having him start on diclofenac and cyclobenzaprine. Additionally, I am having him maintain his Vitamin D3, aspirin EC, fluticasone, sildenafil, Icosapent Ethyl, azelastine, and rosuvastatin. We administered betamethasone acetate-betamethasone sodium phosphate.  Allergies as of 08/24/2017      Reactions   Codeine Nausea Only        Medication List        Accurate as of 08/24/17 11:58 AM. Always use your most recent med list.          aspirin EC 81 MG tablet Take 81 mg by mouth daily.   azelastine 0.1 % nasal spray Commonly known as:  ASTELIN Place 1 spray into both nostrils at bedtime. Use in each nostril as directed   cyclobenzaprine 10 MG tablet Commonly known as:  FLEXERIL Take 1 tablet (10 mg total) 3 (three) times daily as needed by mouth for muscle spasms.   diclofenac 75 MG EC tablet Commonly known as:  VOLTAREN Take 1 tablet (75 mg total) 2 (two) times daily by mouth.   fluticasone 50 MCG/ACT nasal spray Commonly known as:  FLONASE   Icosapent Ethyl 1 g Caps Commonly known as:  VASCEPA Take 1 capsule by mouth 4 (four) times daily.   rosuvastatin 20 MG tablet Commonly known as:  CRESTOR TAKE ONE TABLET DAILY AS DIRECTED   sildenafil 20 MG tablet Commonly known as:  REVATIO Take 2-5 tablets as needed for sexual activity   Vitamin D3 1000 units Caps Take 1 tablet by mouth 1 dose over 46 hours.        Follow-up: Return in about 2 weeks (around 09/07/2017).  Mechele ClaudeWarren Margene Cherian, M.D.

## 2017-09-07 ENCOUNTER — Ambulatory Visit: Payer: 59 | Admitting: Family Medicine

## 2018-03-09 ENCOUNTER — Ambulatory Visit (INDEPENDENT_AMBULATORY_CARE_PROVIDER_SITE_OTHER): Payer: 59 | Admitting: Family Medicine

## 2018-03-09 ENCOUNTER — Encounter: Payer: Self-pay | Admitting: Family Medicine

## 2018-03-09 VITALS — BP 131/85 | HR 70 | Temp 97.6°F | Ht 68.0 in | Wt 179.0 lb

## 2018-03-09 DIAGNOSIS — E559 Vitamin D deficiency, unspecified: Secondary | ICD-10-CM

## 2018-03-09 DIAGNOSIS — E7800 Pure hypercholesterolemia, unspecified: Secondary | ICD-10-CM

## 2018-03-09 DIAGNOSIS — Z0000 Encounter for general adult medical examination without abnormal findings: Secondary | ICD-10-CM | POA: Diagnosis not present

## 2018-03-09 DIAGNOSIS — J301 Allergic rhinitis due to pollen: Secondary | ICD-10-CM

## 2018-03-09 DIAGNOSIS — E781 Pure hyperglyceridemia: Secondary | ICD-10-CM

## 2018-03-09 DIAGNOSIS — N5201 Erectile dysfunction due to arterial insufficiency: Secondary | ICD-10-CM

## 2018-03-09 DIAGNOSIS — N400 Enlarged prostate without lower urinary tract symptoms: Secondary | ICD-10-CM

## 2018-03-09 LAB — URINALYSIS, COMPLETE
BILIRUBIN UA: NEGATIVE
Glucose, UA: NEGATIVE
KETONES UA: NEGATIVE
Leukocytes, UA: NEGATIVE
NITRITE UA: NEGATIVE
PH UA: 5.5 (ref 5.0–7.5)
RBC UA: NEGATIVE
SPEC GRAV UA: 1.02 (ref 1.005–1.030)
Urobilinogen, Ur: 0.2 mg/dL (ref 0.2–1.0)

## 2018-03-09 LAB — MICROSCOPIC EXAMINATION
Bacteria, UA: NONE SEEN
EPITHELIAL CELLS (NON RENAL): NONE SEEN /HPF (ref 0–10)
RENAL EPITHEL UA: NONE SEEN /HPF
WBC, UA: NONE SEEN /hpf (ref 0–5)

## 2018-03-09 MED ORDER — MONTELUKAST SODIUM 10 MG PO TABS: 10.0000 mg | ORAL_TABLET | Freq: Every day | ORAL | 11 refills | Status: DC

## 2018-03-09 MED ORDER — ROSUVASTATIN CALCIUM 20 MG PO TABS: ORAL_TABLET | ORAL | 3 refills | Status: DC

## 2018-03-09 NOTE — Progress Notes (Signed)
Subjective:    Patient ID: Darrell Schneider, male    DOB: 03/03/1961, 57 y.o.   MRN: [id number]  HPI Patient is here today for annual wellness exam and follow up of chronic medical problems which includes hyperlipidemia. He is taking medication regularly.  The patient is doing well today with no specific complaints or problems.  The patient did a Cologuard test in May 2017.  He is requesting refills on his Crestor.  The patient is doing well and feeling well.  He is trying to watch his diet closely especially the sugar and keep his weight down.  One thing that he has not had done in a good while is an eye exam and I did encourage him to get this.  His father is actually an outpatient facility because of his dementia and his mother is 75 years old and stable.  He denies any chest pain pressure tightness or palpitations.  He does have occasional heartburn and indigestion but it is always and regard to something he is eaten that he should not have eaten.  He otherwise denies any trouble with swallowing nausea vomiting diarrhea blood in the stool or black tarry bowel movements or change in bowel habits.  He is passing his water without problems.     Patient Active Problem List   Diagnosis Date Noted  . Insomnia 09/06/2013  . Seasonal allergies 01/07/2011  . Hyperlipidemia    Outpatient Encounter Medications as of 03/09/2018  Medication Sig  . aspirin EC 81 MG tablet Take 81 mg by mouth daily.  . Cholecalciferol (VITAMIN D3) 1000 UNITS CAPS Take 1 tablet by mouth 1 dose over 46 hours.    . rosuvastatin (CRESTOR) 20 MG tablet TAKE ONE TABLET DAILY AS DIRECTED  . azelastine (ASTELIN) 0.1 % nasal spray Place 1 spray into both nostrils at bedtime. Use in each nostril as directed (Patient not taking: Reported on 03/09/2018)  . fluticasone (FLONASE) 50 MCG/ACT nasal spray   . sildenafil (REVATIO) 20 MG tablet Take 2-5 tablets as needed for sexual activity (Patient not taking: Reported on 03/09/2018)  .  [DISCONTINUED] cyclobenzaprine (FLEXERIL) 10 MG tablet Take 1 tablet (10 mg total) 3 (three) times daily as needed by mouth for muscle spasms.  . [DISCONTINUED] diclofenac (VOLTAREN) 75 MG EC tablet Take 1 tablet (75 mg total) 2 (two) times daily by mouth.  . [DISCONTINUED] Icosapent Ethyl (VASCEPA) 1 g CAPS Take 1 capsule by mouth 4 (four) times daily.   No facility-administered encounter medications on file as of 03/09/2018.      Review of Systems  Constitutional: Negative.   HENT: Negative.   Eyes: Negative.   Respiratory: Negative.   Cardiovascular: Negative.   Gastrointestinal: Negative.   Endocrine: Negative.   Genitourinary: Negative.   Musculoskeletal: Negative.   Skin: Negative.   Allergic/Immunologic: Negative.   Neurological: Negative.   Hematological: Negative.   Psychiatric/Behavioral: Negative.        Objective:   Physical Exam  Constitutional: He is oriented to person, place, and time. He appears well-developed and well-nourished.  The patient is pleasant and alert and in good spirits.  HENT:  Head: Normocephalic and atraumatic.  Right Ear: External ear normal.  Left Ear: External ear normal.  Mouth/Throat: Oropharynx is clear and moist. No oropharyngeal exudate.  Nasal turbinate congestion bilaterally  Eyes: Pupils are equal, round, and reactive to light. Conjunctivae and EOM are normal. Right eye exhibits no discharge. Left eye exhibits no discharge.  Patient is in need of  eye exam and will arrange this.  Neck: Normal range of motion. Neck supple. No thyromegaly present.  No thyromegaly bruits or anterior cervical adenopathy  Cardiovascular: Normal rate, regular rhythm, normal heart sounds and intact distal pulses.  No murmur heard. Heart has a regular rate and rhythm at 60/min with good pedal pulses  Pulmonary/Chest: Effort normal and breath sounds normal. He has no wheezes. He has no rales. He exhibits no tenderness.  Clear anteriorly and posteriorly and  no axillary adenopathy  Abdominal: Soft. Bowel sounds are normal. He exhibits no mass. There is no tenderness.  No abdominal tenderness masses organ enlargement bruits or inguinal adenopathy  Genitourinary: Rectum normal and penis normal.  Genitourinary Comments: The prostate is slightly enlarged without lumps or masses.  There were no rectal masses.  External genitalia were within normal limits with no testicular abnormalities and no inguinal hernias being palpated.  Musculoskeletal: Normal range of motion. He exhibits no edema or tenderness.  Lymphadenopathy:    He has no cervical adenopathy.  Neurological: He is alert and oriented to person, place, and time. He has normal reflexes. No cranial nerve deficit.  Skin: Skin is warm and dry. No rash noted.  Psychiatric: He has a normal mood and affect. His behavior is normal. Judgment and thought content normal.  The patient is alert and in good spirits and has normal mood affect and behavior  Nursing note and vitals reviewed.  BP 131/85   Pulse 70   Temp 97.6 F (36.4 C) (Oral)   Ht '5\' 8"'$  (1.727 m)   Wt 179 lb (81.2 kg)   BMI 27.22 kg/m         Assessment & Plan:  1. Annual physical exam -This patient does need an eye exam.  I recommended Dr. Renford Dills or Josie Dixon with [facility] with Differential/Platelet - BMP8+EGFR - Lipid panel - PSA, total and free - VITAMIN D 25 Hydroxy (Vit-D Deficiency, Fractures) - Hepatic function panel - Thyroid Panel With TSH - Urinalysis, Complete  2. Pure hypercholesterolemia -Continue Crestor and aggressive therapeutic lifestyle changes pending results of lab work - CBC with Differential/Platelet - BMP8+EGFR - Lipid panel - Hepatic function panel  3. Benign prostatic hyperplasia, unspecified whether lower urinary tract symptoms present -The patient is having no symptoms with his prostate currently. - CBC with Differential/Platelet - PSA, total and free - Urinalysis,  Complete  4. Vitamin D deficiency -Continue with vitamin D replacement - CBC with Differential/Platelet - VITAMIN D 25 Hydroxy (Vit-D Deficiency, Fractures)  5. Hypertriglyceridemia -Continue with Crestor pending results of lab work - CBC with Differential/Platelet  6. Erectile dysfunction due to arterial insufficiency -Continue with current treatment if any.  Watch diet and keep weight down.  7. Seasonal allergic rhinitis due to pollen -Consider adding Singulair to current treatment regimen  Meds ordered this encounter  Medications  . rosuvastatin (CRESTOR) 20 MG tablet    Sig: TAKE ONE TABLET DAILY AS DIRECTED    Dispense:  90 tablet    Refill:  3   Patient Instructions  Continue current medications. Continue good therapeutic lifestyle changes which include good diet and exercise. Fall precautions discussed with patient. If an FOBT was given today- please return it to our front desk. If you are over 38 years old - you may need Prevnar 56 or the adult Pneumonia vaccine.  **Flu shots are available--- please call and schedule a FLU-CLINIC appointment**  After your visit with [place] today you will receive a survey  in the mail or online from [employer] regarding your care with [place]. Please take a moment to fill this out. Your feedback is very important to [place] as you can help [place] better understand your patient needs as well as improve your experience and satisfaction. WE CARE ABOUT YOU!!!   The Cologuard is good for 3 years.  This was done last in May 2017.  This is just for your records information. Return the FOBT and do this yearly It is important that you get your eyes checked.  You may want to consider making an appointment with an ophthalmologist. We will call with lab work results as soon as those results become available.  Consider adding Singulair to current treatment regimen of Flonase nonsedating antihistamine like Claritin or Allegra.  The Singulair could be used especially  during peak problem times.  Arrie Senate MD

## 2018-03-09 NOTE — Patient Instructions (Addendum)
Continue current medications. Continue good therapeutic lifestyle changes which include good diet and exercise. Fall precautions discussed with patient. If an FOBT was given today- please return it to our front desk. If you are over 57 years old - you may need Prevnar 13 or the adult Pneumonia vaccine.  **Flu shots are available--- please call and schedule a FLU-CLINIC appointment**  After your visit with [place] today you will receive a survey in the mail or online from [employer] regarding your care with [place]. Please take a moment to fill this out. Your feedback is very important to [place] as you can help [place] better understand your patient needs as well as improve your experience and satisfaction. WE CARE ABOUT YOU!!!   The Cologuard is good for 3 years.  This was done last in May 2017.  This is just for your records information. Return the FOBT and do this yearly It is important that you get your eyes checked.  You may want to consider making an appointment with an ophthalmologist. We will call with lab work results as soon as those results become available.  Consider adding Singulair to current treatment regimen of Flonase nonsedating antihistamine like Claritin or Allegra.  The Singulair could be used especially during peak problem times.

## 2018-03-09 NOTE — Addendum Note (Signed)
Addended by: Magdalene River on: 03/09/2018 09:24 AM   Modules accepted: Orders

## 2018-03-10 LAB — LIPID PANEL
CHOLESTEROL TOTAL: 174 mg/dL (ref 100–199)
Chol/HDL Ratio: 3.7 ratio (ref 0.0–5.0)
HDL: 47 mg/dL (ref >39)
LDL CALC: 101 mg/dL — AB (ref 0–99)
TRIGLYCERIDES: 131 mg/dL (ref 0–149)
VLDL CHOLESTEROL CAL: 26 mg/dL (ref 5–40)

## 2018-03-10 LAB — CBC WITH DIFFERENTIAL/PLATELET
BASOS: 1 %
Basophils Absolute: 0 10*3/uL (ref 0.0–0.2)
EOS (ABSOLUTE): 0.2 10*3/uL (ref 0.0–0.4)
EOS: 4 %
HEMATOCRIT: 46.7 % (ref 37.5–51.0)
HEMOGLOBIN: 15.7 g/dL (ref 13.0–17.7)
IMMATURE GRANS (ABS): 0 10*3/uL (ref 0.0–0.1)
Immature Granulocytes: 0 %
LYMPHS: 42 %
Lymphocytes Absolute: 2.5 10*3/uL (ref 0.7–3.1)
MCH: 31.5 pg (ref 26.6–33.0)
MCHC: 33.6 g/dL (ref 31.5–35.7)
MCV: 94 fL (ref 79–97)
MONOCYTES: 8 %
Monocytes Absolute: 0.5 10*3/uL (ref 0.1–0.9)
NEUTROS ABS: 2.8 10*3/uL (ref 1.4–7.0)
Neutrophils: 45 %
Platelets: 216 10*3/uL (ref 150–450)
RBC: 4.99 x10E6/uL (ref 4.14–5.80)
RDW: 14.2 % (ref 12.3–15.4)
WBC: 6 10*3/uL (ref 3.4–10.8)

## 2018-03-10 LAB — HEPATIC FUNCTION PANEL
ALBUMIN: 4.5 g/dL (ref 3.5–5.5)
ALK PHOS: 94 IU/L (ref 39–117)
ALT: 23 IU/L (ref 0–44)
AST: 20 IU/L (ref 0–40)
BILIRUBIN TOTAL: 0.6 mg/dL (ref 0.0–1.2)
BILIRUBIN, DIRECT: 0.15 mg/dL (ref 0.00–0.40)
Total Protein: 7.1 g/dL (ref 6.0–8.5)

## 2018-03-10 LAB — BMP8+EGFR
BUN/Creatinine Ratio: 16 (ref 9–20)
BUN: 15 mg/dL (ref 6–24)
CO2: 24 mmol/L (ref 20–29)
CREATININE: 0.96 mg/dL (ref 0.76–1.27)
Calcium: 9.2 mg/dL (ref 8.7–10.2)
Chloride: 102 mmol/L (ref 96–106)
GFR calc Af Amer: 101 mL/min/{1.73_m2} (ref >59)
GFR, EST NON AFRICAN AMERICAN: 87 mL/min/{1.73_m2} (ref >59)
GLUCOSE: 106 mg/dL — AB (ref 65–99)
Potassium: 4.2 mmol/L (ref 3.5–5.2)
Sodium: 140 mmol/L (ref 134–144)

## 2018-03-10 LAB — THYROID PANEL WITH TSH
Free Thyroxine Index: 1.6 (ref 1.2–4.9)
T3 Uptake Ratio: 23 % — ABNORMAL LOW (ref 24–39)
T4 TOTAL: 6.8 ug/dL (ref 4.5–12.0)
TSH: 3.18 u[IU]/mL (ref 0.450–4.500)

## 2018-03-10 LAB — VITAMIN D 25 HYDROXY (VIT D DEFICIENCY, FRACTURES): Vit D, 25-Hydroxy: 43 ng/mL (ref 30.0–100.0)

## 2018-03-10 LAB — PSA, TOTAL AND FREE
PSA FREE: 0.21 ng/mL
PSA, Free Pct: 35 %
Prostate Specific Ag, Serum: 0.6 ng/mL (ref 0.0–4.0)

## 2018-06-24 ENCOUNTER — Other Ambulatory Visit: Payer: Self-pay

## 2018-06-29 ENCOUNTER — Telehealth: Payer: Self-pay | Admitting: Family Medicine

## 2018-06-29 MED ORDER — ROSUVASTATIN CALCIUM 20 MG PO TABS: ORAL_TABLET | ORAL | 3 refills | Status: DC

## 2018-06-29 MED ORDER — MONTELUKAST SODIUM 10 MG PO TABS: 10.0000 mg | ORAL_TABLET | Freq: Every day | ORAL | 2 refills | Status: DC

## 2018-06-29 NOTE — Telephone Encounter (Signed)
What is the name of the medication?  Darrell (CRESTOR) 20 MG tablet Darrell (SINGULAIR) 10 MG tablet and   Have you contacted your pharmacy to request a refill? yes  Which pharmacy would you like this sent to? [facility] phone [phone] fax is [phone]   Patient notified that their request is being sent to the clinical staff for review and that they should receive a call once it is complete. If they do not receive a call within 24 hours they can check with their pharmacy or our office.

## 2018-06-29 NOTE — Telephone Encounter (Signed)
RX sent to pharmacy  

## 2018-10-12 IMAGING — DX DG CHEST 2V
2 series · 2 of 2 positions shown · non-contrast
Comparison: None.

CLINICAL DATA: Annual physical

EXAM:
CHEST  2 VIEW

[chest pa]
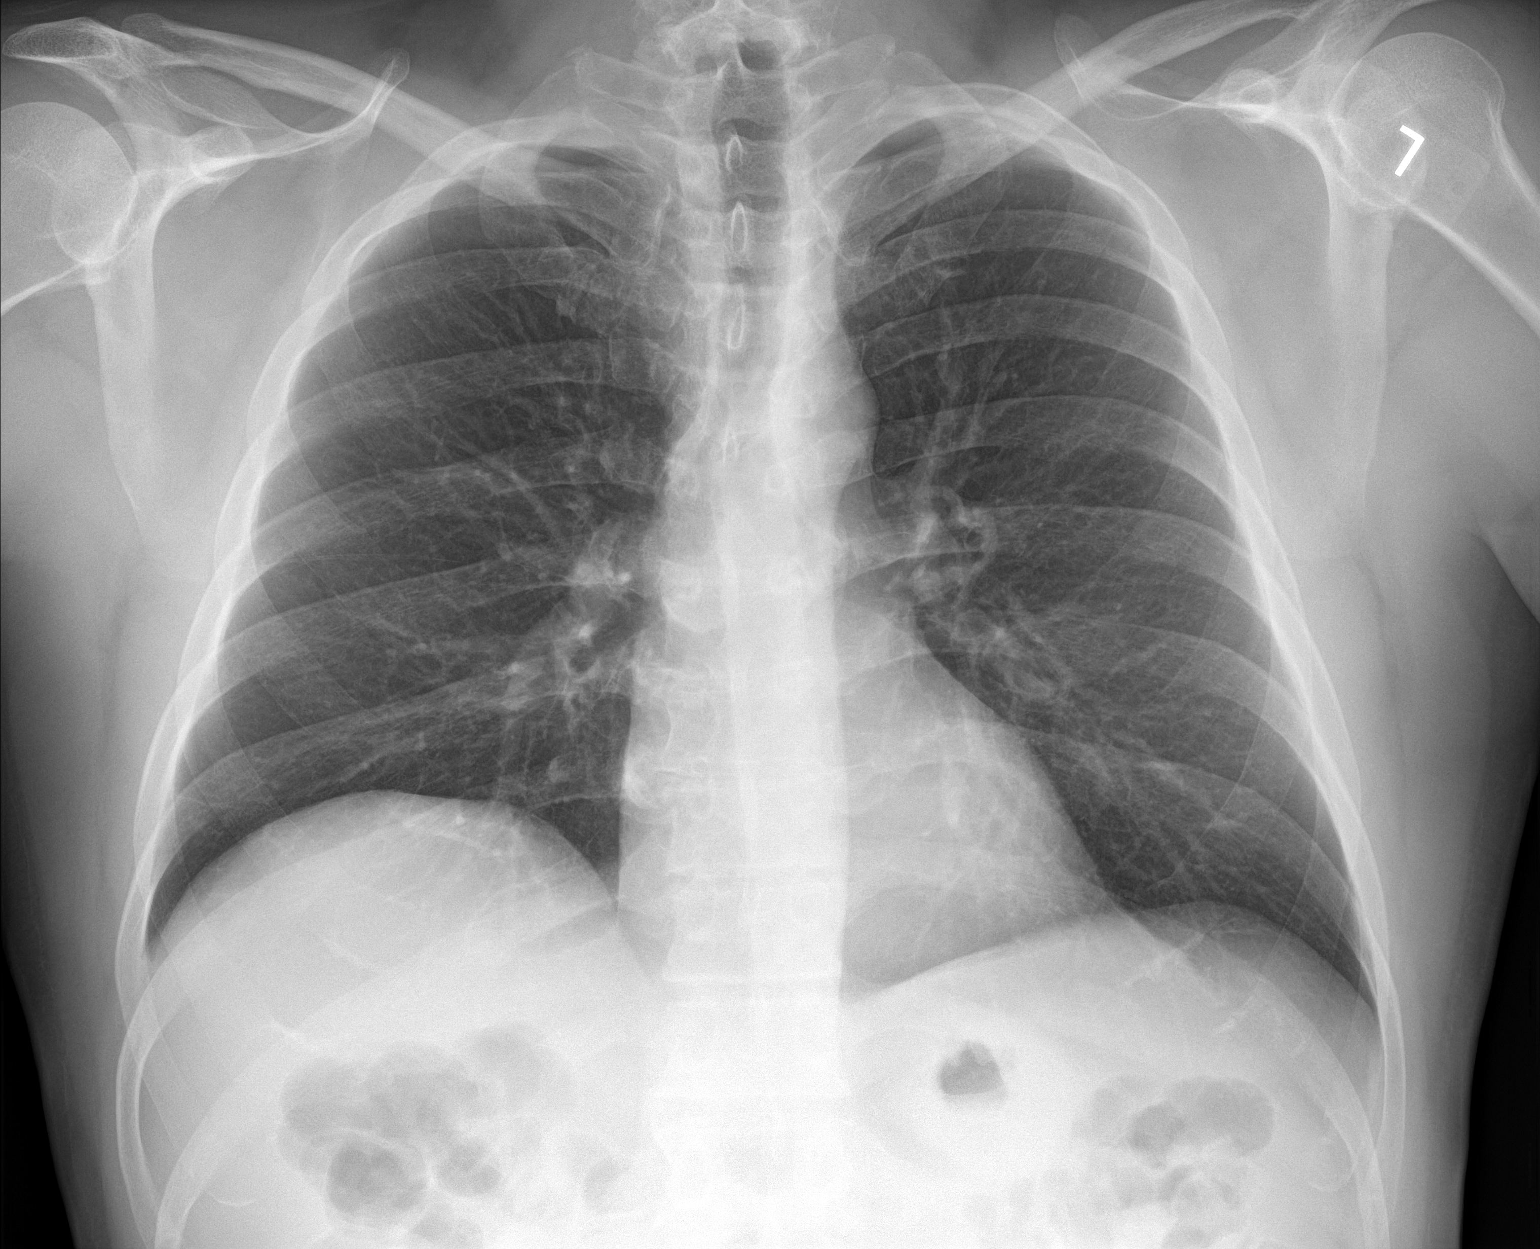

[chest lat]
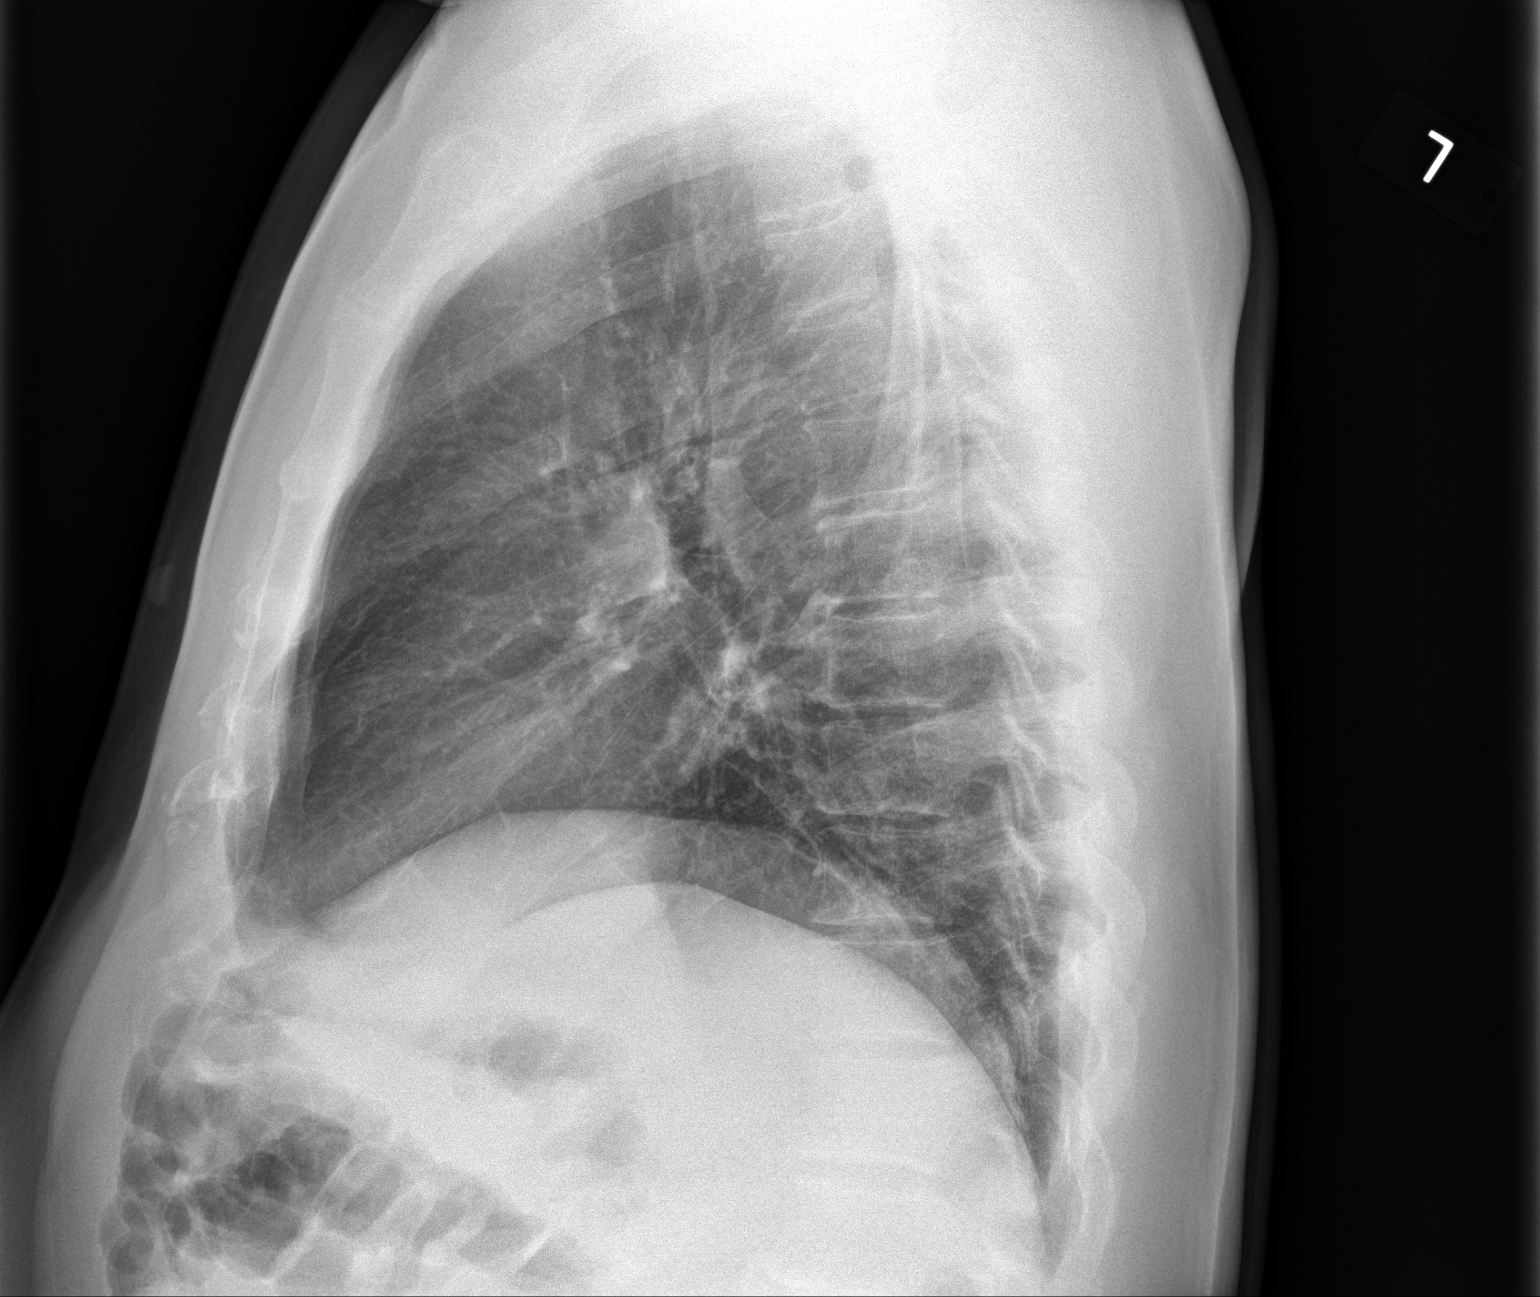

[2 of 2 positions shown; findings below may reference images not displayed]

FINDINGS: The heart size and mediastinal contours are within normal limits.
Both lungs are clear. The visualized skeletal structures are
unremarkable.
IMPRESSION: No active cardiopulmonary disease.

## 2018-10-31 ENCOUNTER — Encounter: Payer: Self-pay | Admitting: Nurse Practitioner

## 2018-10-31 ENCOUNTER — Ambulatory Visit (INDEPENDENT_AMBULATORY_CARE_PROVIDER_SITE_OTHER): Payer: 59 | Admitting: Nurse Practitioner

## 2018-10-31 VITALS — BP 137/86 | HR 90 | Temp 98.0°F | Ht 68.0 in | Wt 183.0 lb

## 2018-10-31 DIAGNOSIS — R52 Pain, unspecified: Secondary | ICD-10-CM | POA: Diagnosis not present

## 2018-10-31 DIAGNOSIS — R509 Fever, unspecified: Secondary | ICD-10-CM

## 2018-10-31 DIAGNOSIS — R079 Chest pain, unspecified: Secondary | ICD-10-CM

## 2018-10-31 DIAGNOSIS — R5383 Other fatigue: Secondary | ICD-10-CM | POA: Diagnosis not present

## 2018-10-31 MED ORDER — DOXYCYCLINE HYCLATE 100 MG PO TABS: 100.0000 mg | ORAL_TABLET | Freq: Two times a day (BID) | ORAL | 0 refills | Status: DC

## 2018-10-31 NOTE — Progress Notes (Signed)
Subjective:    Patient ID: Darrell Schneider, male    DOB: [birth year], 58 y.o.   MRN: [id number]   Chief Complaint: Generalized Body Aches (off and on for 1 month   concerned about tick bitres); Fever; Nausea; and neck sore   HPI Patient comes in today c/o body aches intermittent fever and neck soreness. This has been going on for over a month. Yesterday he felt terrible and all he wanted to do was lay around, but this morning he feels okay. He recalls a tick bite that left a bulls eye spot but he never got treated. He says a couple of time she has had some chest tightness where he felt like it was hard to breathe. He also says he is very fatigued.   Review of Systems  Constitutional: Positive for fatigue and fever (intermitttent).  HENT: Negative.   Respiratory: Negative for cough and shortness of breath.   Cardiovascular: Positive for chest pain. Negative for leg swelling.  Genitourinary: Negative.   Musculoskeletal: Positive for myalgias.  Neurological: Positive for headaches.  Psychiatric/Behavioral: Negative.   All other systems reviewed and are negative.      Objective:   Physical Exam Vitals signs and nursing note reviewed.  Constitutional:      Appearance: Normal appearance. He is well-developed.  HENT:     Head: Normocephalic.     Nose: Nose normal.  Eyes:     Pupils: Pupils are equal, round, and reactive to light.  Neck:     Musculoskeletal: Normal range of motion and neck supple.     Thyroid: No thyroid mass or thyromegaly.     Vascular: No carotid bruit or JVD.     Trachea: Phonation normal.  Cardiovascular:     Rate and Rhythm: Normal rate and regular rhythm.  Pulmonary:     Effort: Pulmonary effort is normal. No respiratory distress.     Breath sounds: Normal breath sounds.  Abdominal:     General: Bowel sounds are normal.     Palpations: Abdomen is soft.     Tenderness: There is no abdominal tenderness.  Musculoskeletal: Normal range of motion.    Lymphadenopathy:     Cervical: No cervical adenopathy.  Skin:    General: Skin is warm and dry.  Neurological:     Mental Status: He is alert and oriented to person, place, and time.  Psychiatric:        Behavior: Behavior normal.        Thought Content: Thought content normal.        Judgment: Judgment normal.    BP 137/86   Pulse 90   Temp 98 F (36.7 C) (Oral)   Ht _0  (1.727 m)   Wt 183 lb (83 kg)   SpO2 97%   BMI 27.83 kg/m   EKG- Darrell Hough, FNP       Assessment & Plan:  Darrell Schneider in today with chief complaint of Generalized Body Aches (off and on for 1 month   concerned about tick bitres); Fever; Nausea; and neck sore   1. Chest pain, unspecified type - EKG 12-Lead  2. Other fatigue Force fluid Rest Testing for lymes disease - CMP14+EGFR - Thyroid Panel With TSH - Lyme Ab/Western Blot Reflex - Rocky mtn spotted fvr abs pnl(IgG+IgM) - CBC with Differential/Platelet - doxycycline (VIBRA-TABS) 100 MG tablet; Take 1 tablet (100 mg total) by mouth 2 (two) times daily. 1 po bid  Dispense: 42 tablet; Refill: 0  3. Intermittent  fever Motrin or tylenol as needed  4. Body aches Rest  Labs pending  Meds ordered this encounter  Medications  . doxycycline (VIBRA-TABS) 100 MG tablet    Sig: Take 1 tablet (100 mg total) by mouth 2 (two) times daily. 1 po bid    Dispense:  42 tablet    Refill:  0    Order Specific Question:   Supervising Provider    Answer:   Darrell Schneider [id number]  * will call with lab results and will let know if need to continue antibiotic.  Darrell Hassell Done, FNP

## 2018-10-31 NOTE — Patient Instructions (Signed)
Lyme Disease  Lyme disease is an infection that affects many parts of the body, including the skin, joints, and nervous system. It is a bacterial infection that starts from the bite of an infected tick. The infection can spread, and some of the symptoms are similar to the flu. If Lyme disease is not treated, it may cause joint pain, swelling, numbness, problems thinking, fatigue, muscle weakness, and other problems. What are the causes? This condition is caused by bacteria called Borrelia burgdorferi. You can get Lyme disease by being bitten by an infected tick. The tick must be attached to your skin to pass along the infection. Deer often carry infected ticks. What increases the risk? The following factors may make you more likely to develop this condition:  Living in or visiting these areas in the [place] ? [place]. ? [place]. ? [place].  Spending time in wooded or grassy areas.  Being outdoors with exposed skin.  Camping, gardening, hiking, fishing, or hunting outdoors.  Failing to remove a tick from your skin within 3-4 days. What are the signs or symptoms? Symptoms of this condition include:  A round, red rash that surrounds the center of the tick bite. This is the first sign of infection. The center of the rash may be blood colored or have tiny blisters.  Fatigue.  Headache.  Chills and fever.  General achiness.  Joint pain, often in the knees.  Muscle pain.  Swollen lymph glands.  Stiff neck. How is this diagnosed? This condition is diagnosed based on:  Your symptoms and medical history.  A physical exam.  A blood test. How is this treated? The main treatment for this condition is antibiotic medicine, which is usually taken by mouth (orally). The length of treatment depends on how soon after a tick bite you begin taking the medicine. In some cases, treatment is necessary for several weeks. If the infection is severe, antibiotics may  need to be given through an IV tube that is inserted into one of your veins. Follow these instructions at home:  Take your antibiotic medicine as told by your health care provider. Do not stop taking the antibiotic even if you start to feel better.  Ask your health care provider about takinga probiotic in between doses of your antibiotic to help avoid stomach upset or diarrhea.  Check with your health care provider before supplementing your treatment. Many alternative therapies have not been proven and may be harmful to you.  Keep all follow-up visits as told by your health care provider. This is important. How is this prevented? You can become reinfected if you get another tick bite from an infected tick. Take these steps to help prevent an infection:  Cover your skin with light-colored clothing when you are outdoors in the spring and summer months.  Spray clothing and skin with bug spray. The spray should be 20-30% DEET.  Avoid wooded, grassy, and shaded areas.  Remove yard litter, brush, trash, and plants that attract deer and rodents.  Check yourself for ticks when you come indoors.  Wash clothing worn each day.  Check your pets for ticks before they come inside.  If you find a tick: ? Remove it with tweezers. ? Clean your hands and the bite area with rubbing alcohol or soap and water. Pregnant women should take special care to avoid tick bites because the infection can be passed along to the fetus. Contact a health care provider if:  You have symptoms   after treatment.  You have removed a tick and want to bring it to your health care provider for testing. Get help right away if:  You have an irregular heartbeat.  You have nerve pain.  Your face feels numb. This information is not intended to replace advice given to you by your health care provider. Make sure you discuss any questions you have with your health care provider. Document Released: 01/11/2001 Document  Revised: 05/26/2016 Document Reviewed: 05/26/2016 Elsevier Interactive Patient Education  2019 Elsevier Inc.  

## 2018-11-03 ENCOUNTER — Other Ambulatory Visit: Payer: Self-pay

## 2018-11-03 DIAGNOSIS — R7309 Other abnormal glucose: Secondary | ICD-10-CM

## 2018-11-03 LAB — LYME AB/WESTERN BLOT REFLEX: Lyme IgG/IgM Ab: 0.95 {ISR} — ABNORMAL HIGH (ref 0.00–0.90)

## 2018-11-03 LAB — CBC WITH DIFFERENTIAL/PLATELET
BASOS: 0 %
Basophils Absolute: 0 10*3/uL (ref 0.0–0.2)
EOS (ABSOLUTE): 0.1 10*3/uL (ref 0.0–0.4)
Eos: 1 %
Hematocrit: 44.5 % (ref 37.5–51.0)
Hemoglobin: 15.4 g/dL (ref 13.0–17.7)
Immature Grans (Abs): 0.1 10*3/uL (ref 0.0–0.1)
Immature Granulocytes: 1 %
LYMPHS ABS: 2.1 10*3/uL (ref 0.7–3.1)
Lymphs: 14 %
MCH: 31 pg (ref 26.6–33.0)
MCHC: 34.6 g/dL (ref 31.5–35.7)
MCV: 90 fL (ref 79–97)
MONOS ABS: 0.9 10*3/uL (ref 0.1–0.9)
Monocytes: 6 %
NEUTROS ABS: 11.9 10*3/uL — AB (ref 1.4–7.0)
Neutrophils: 78 %
PLATELETS: 257 10*3/uL (ref 150–450)
RBC: 4.97 x10E6/uL (ref 4.14–5.80)
RDW: 12.4 % (ref 11.6–15.4)
WBC: 15.1 10*3/uL — ABNORMAL HIGH (ref 3.4–10.8)

## 2018-11-03 LAB — LYME, WESTERN BLOT, SERUM (REFLEXED)
IGG P30 AB.: ABSENT
IgG P23 Ab.: ABSENT
IgG P28 Ab.: ABSENT
IgG P39 Ab.: ABSENT
IgG P45 Ab.: ABSENT
IgG P58 Ab.: ABSENT
IgG P66 Ab.: ABSENT
IgG P93 Ab.: ABSENT
IgM P23 Ab.: ABSENT
IgM P39 Ab.: ABSENT
IgM P41 Ab.: ABSENT
LYME IGG WB: NEGATIVE
Lyme IgM Wb: NEGATIVE

## 2018-11-03 LAB — CMP14+EGFR
ALBUMIN: 4.4 g/dL (ref 3.5–5.5)
ALT: 24 IU/L (ref 0–44)
AST: 21 IU/L (ref 0–40)
Albumin/Globulin Ratio: 1.5 (ref 1.2–2.2)
Alkaline Phosphatase: 109 IU/L (ref 39–117)
BILIRUBIN TOTAL: 0.4 mg/dL (ref 0.0–1.2)
BUN / CREAT RATIO: 11 (ref 9–20)
BUN: 12 mg/dL (ref 6–24)
CALCIUM: 9.7 mg/dL (ref 8.7–10.2)
CHLORIDE: 100 mmol/L (ref 96–106)
CO2: 25 mmol/L (ref 20–29)
Creatinine, Ser: 1.08 mg/dL (ref 0.76–1.27)
GFR, EST AFRICAN AMERICAN: 88 mL/min/{1.73_m2} (ref >59)
GFR, EST NON AFRICAN AMERICAN: 76 mL/min/{1.73_m2} (ref >59)
Globulin, Total: 2.9 g/dL (ref 1.5–4.5)
Glucose: 137 mg/dL — ABNORMAL HIGH (ref 65–99)
Potassium: 4.9 mmol/L (ref 3.5–5.2)
Sodium: 139 mmol/L (ref 134–144)
TOTAL PROTEIN: 7.3 g/dL (ref 6.0–8.5)

## 2018-11-03 LAB — THYROID PANEL WITH TSH
FREE THYROXINE INDEX: 2.1 (ref 1.2–4.9)
T3 UPTAKE RATIO: 26 % (ref 24–39)
T4, Total: 7.9 ug/dL (ref 4.5–12.0)
TSH: 3.04 u[IU]/mL (ref 0.450–4.500)

## 2018-11-03 LAB — ROCKY MTN SPOTTED FVR ABS PNL(IGG+IGM)
RMSF IGG: NEGATIVE
RMSF IgM: 1.54 index — ABNORMAL HIGH (ref 0.00–0.89)

## 2018-11-05 LAB — HGB A1C W/O EAG: Hgb A1c MFr Bld: 6.1 % — ABNORMAL HIGH (ref 4.8–5.6)

## 2018-11-12 ENCOUNTER — Telehealth: Payer: Self-pay | Admitting: Family Medicine

## 2018-11-12 NOTE — Telephone Encounter (Signed)
Spoke with pt, MMM said to continue all of antiiotic before he would feel a lot better. Call back if needed

## 2018-12-12 ENCOUNTER — Encounter: Payer: Self-pay | Admitting: Family

## 2018-12-12 ENCOUNTER — Ambulatory Visit (INDEPENDENT_AMBULATORY_CARE_PROVIDER_SITE_OTHER): Payer: 59 | Admitting: Family

## 2018-12-12 VITALS — BP 132/88 | HR 76 | Temp 98.4°F | Ht 68.0 in | Wt 178.2 lb

## 2018-12-12 DIAGNOSIS — J101 Influenza due to other identified influenza virus with other respiratory manifestations: Secondary | ICD-10-CM

## 2018-12-12 DIAGNOSIS — R6889 Other general symptoms and signs: Secondary | ICD-10-CM | POA: Diagnosis not present

## 2018-12-12 LAB — VERITOR FLU A/B WAIVED
Influenza A: POSITIVE — AB
Influenza B: NEGATIVE

## 2018-12-12 NOTE — Progress Notes (Signed)
Subjective:    Patient ID: Darrell Schneider, male    DOB: April 02, 1961, 58 y.o.   MRN: [id number]  Chief Complaint  Patient presents with  . Generalized Body Aches  . Headache  . Fever  . Cough    congestion   PT presents to the office today with Flu like symptoms that started two days ago. 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 He completed three weeks of doxycyline on 11/21/18.   Headache   Associated symptoms include coughing and a fever. Pertinent negatives include no ear pain, rhinorrhea or sore throat.  Fever   Associated symptoms include coughing and headaches. Pertinent negatives include no ear pain, sore throat or wheezing.  Cough  This is a new problem. The current episode started in the past 7 days. The problem has been unchanged. The cough is non-productive. Associated symptoms include chills, a fever, headaches and myalgias. Pertinent negatives include no ear congestion, ear pain, nasal congestion, postnasal drip, rhinorrhea, sore throat, shortness of breath or wheezing. He has tried rest and OTC cough suppressant for the symptoms. The treatment provided mild relief.      Review of Systems  Constitutional: Positive for chills and fever.  HENT: Negative for ear pain, postnasal drip, rhinorrhea and sore throat.   Respiratory: Positive for cough. Negative for shortness of breath and wheezing.   Musculoskeletal: Positive for myalgias.  Neurological: Positive for headaches.  All other systems reviewed and are negative.      Objective:   Physical Exam Vitals signs reviewed.  Constitutional:      General: He is not in acute distress.    Appearance: He is well-developed.  HENT:     Head: Normocephalic.     Right Ear: External ear normal.     Left Ear: External ear normal.     Nose: Congestion present.     Mouth/Throat:     Pharynx: Posterior oropharyngeal erythema present.  Eyes:     General:        Right eye: No discharge.        Left eye: No discharge.   Pupils: Pupils are equal, round, and reactive to light.  Neck:     Musculoskeletal: Normal range of motion and neck supple.     Thyroid: No thyromegaly.  Cardiovascular:     Rate and Rhythm: Normal rate and regular rhythm.     Heart sounds: Normal heart sounds. No murmur.  Pulmonary:     Effort: Pulmonary effort is normal. No respiratory distress.     Breath sounds: Normal breath sounds. No wheezing.  Abdominal:     General: Bowel sounds are normal. There is no distension.     Palpations: Abdomen is soft.     Tenderness: There is no abdominal tenderness.  Musculoskeletal: Normal range of motion.        General: No tenderness.  Skin:    General: Skin is warm and dry.     Findings: No erythema or rash.  Neurological:     Mental Status: He is alert and oriented to person, place, and time.     Cranial Nerves: No cranial nerve deficit.     Deep Tendon Reflexes: Reflexes are normal and symmetric.  Psychiatric:        Behavior: Behavior normal.        Thought Content: Thought content normal.        Judgment: Judgment normal.       BP 132/88   Pulse 76  Temp 98.4 F (36.9 C) (Oral)   Ht 5\' 8"  (1.727 m)   Wt 178 lb 3.2 oz (80.8 kg)   BMI 27.10 kg/m      Assessment & Plan:  Darrell Schneider comes in today with chief complaint of Generalized Body Aches; Headache; Fever; and Cough (congestion)   Diagnosis and orders addressed:  1. Flu-like symptoms - Veritor Flu A/B Waived  2. Influenza A Rest Force fluids Tylenol prn  Droplet precautions  RTO if symptoms worsen or do not improve    Jannifer Rodney, FNP

## 2018-12-12 NOTE — Patient Instructions (Signed)

## 2019-03-09 ENCOUNTER — Other Ambulatory Visit: Payer: Self-pay | Admitting: *Deleted

## 2019-03-09 MED ORDER — ROSUVASTATIN CALCIUM 20 MG PO TABS: ORAL_TABLET | ORAL | 3 refills | Status: DC

## 2019-03-16 ENCOUNTER — Ambulatory Visit: Payer: 59 | Admitting: Family Medicine

## 2019-03-30 IMAGING — DX DG LUMBAR SPINE 2-3V
2 series · 2 of 2 positions shown · non-contrast
Comparison: None.

CLINICAL DATA: Pain after twisting injury several weeks prior

EXAM:
LUMBAR SPINE - 2-3 VIEW

[l-spine ap]
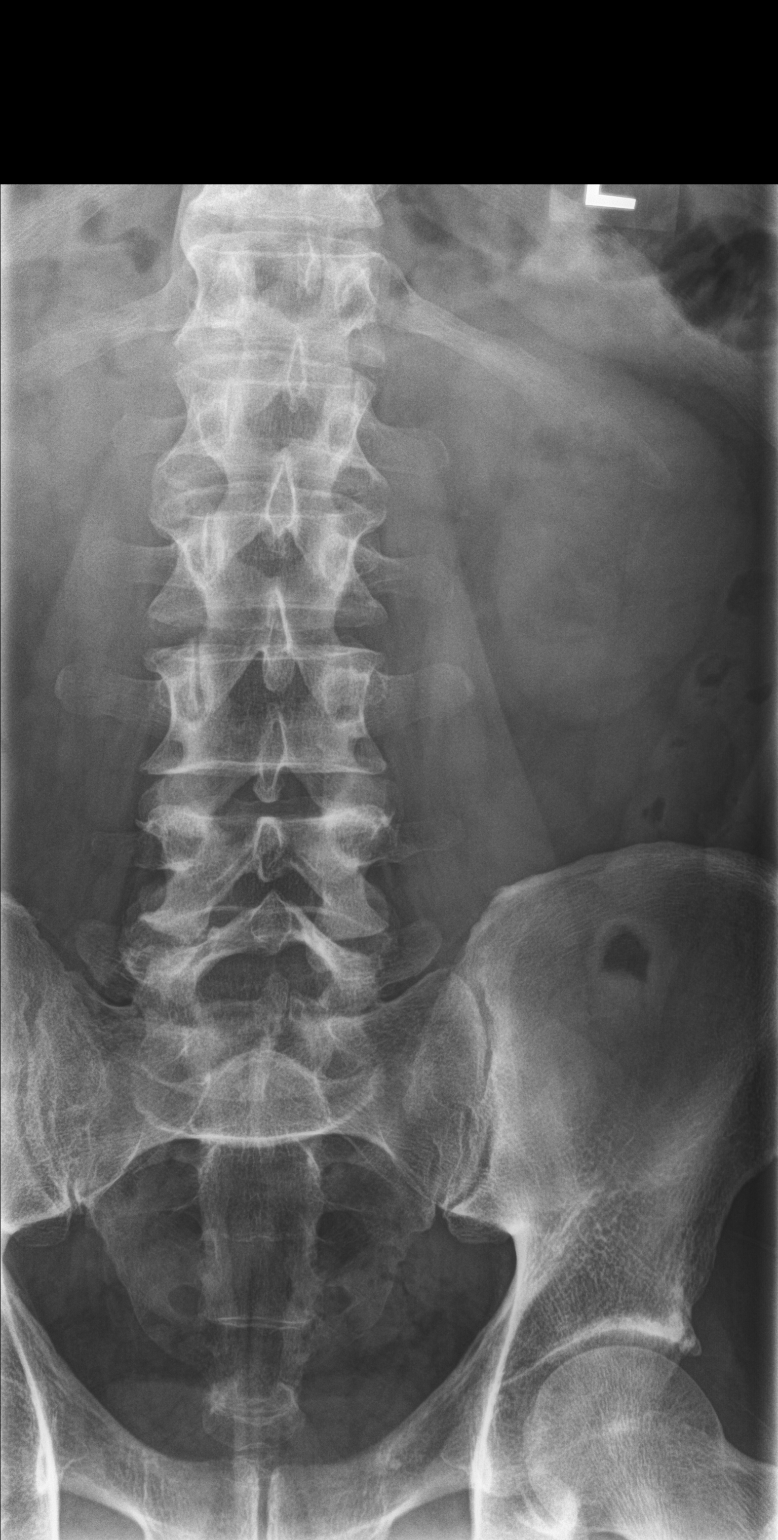

[l-spine lat]
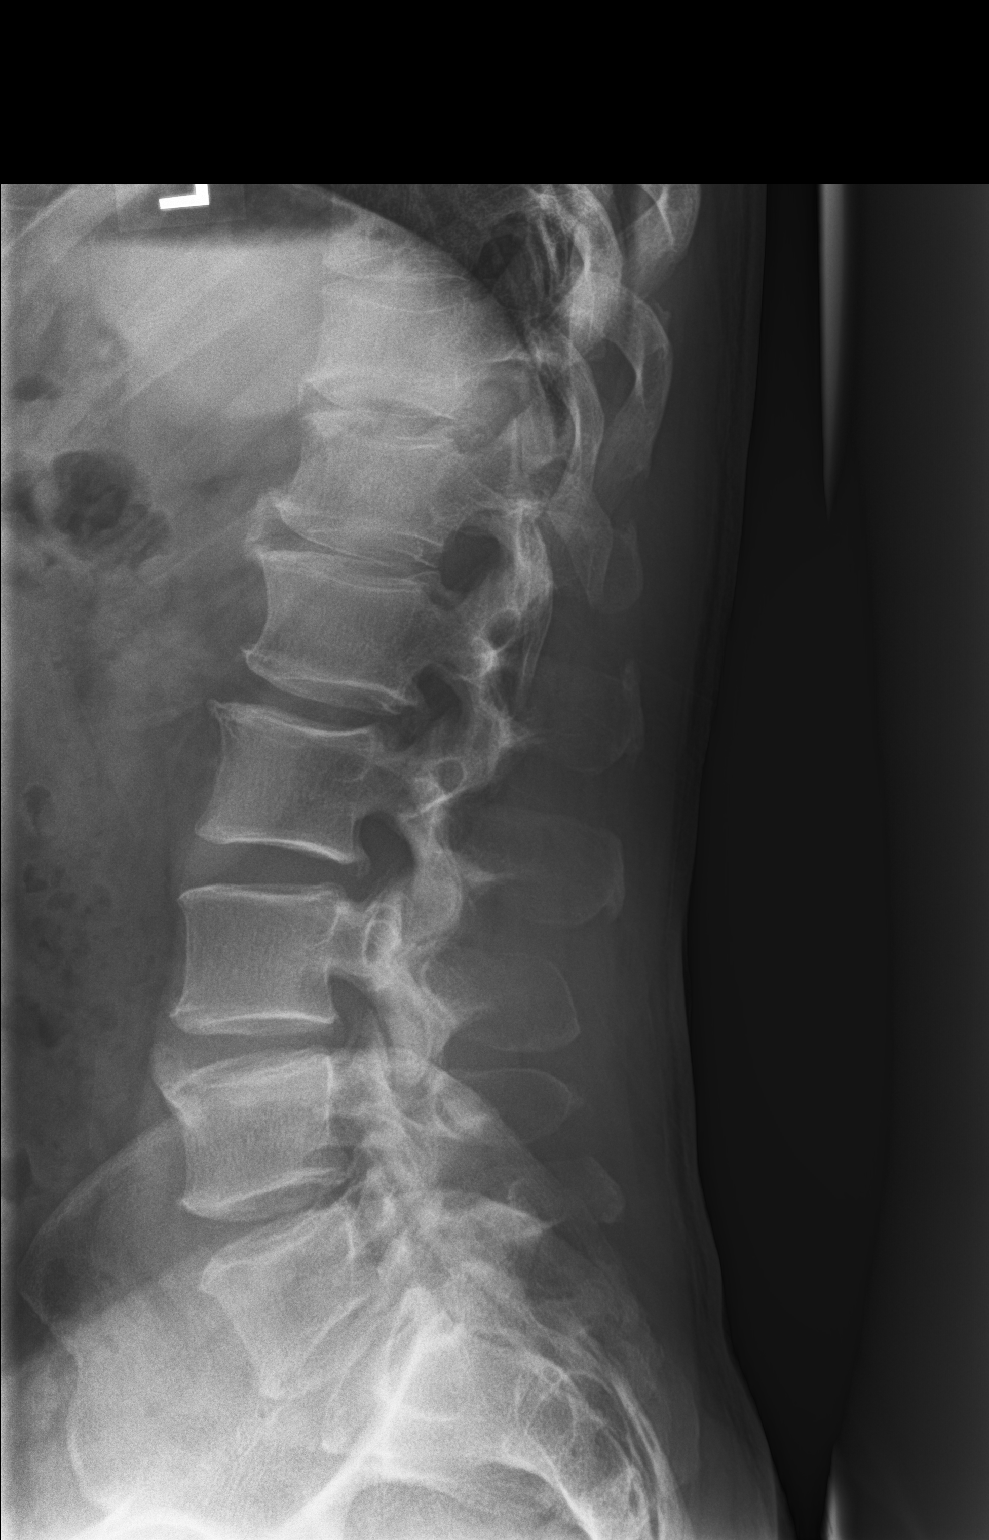

[2 of 2 positions shown; findings below may reference images not displayed]

FINDINGS: Frontal and lateral views were obtained. There are 5 non-rib-bearing
lumbar type vertebral bodies. There is no fracture or
spondylolisthesis. There is mild disc space narrowing at L3-4 and
L4-5. There are anterior osteophytes at all levels. No erosive
change.
IMPRESSION: No fracture or spondylolisthesis. Relatively mild osteoarthritic
change at L3-4 and L4-5.

## 2019-05-03 ENCOUNTER — Ambulatory Visit: Payer: 59 | Admitting: Family Medicine

## 2019-09-11 ENCOUNTER — Other Ambulatory Visit: Payer: Self-pay | Admitting: Family Medicine

## 2019-09-11 NOTE — Telephone Encounter (Signed)
Needs to make an appt with new PCP

## 2020-02-07 ENCOUNTER — Other Ambulatory Visit: Payer: Self-pay | Admitting: Family Medicine

## 2020-02-13 ENCOUNTER — Other Ambulatory Visit: Payer: Self-pay | Admitting: Family Medicine

## 2020-02-13 ENCOUNTER — Other Ambulatory Visit: Payer: Self-pay | Admitting: *Deleted

## 2020-02-13 ENCOUNTER — Telehealth: Payer: Self-pay | Admitting: Family Medicine

## 2020-02-13 DIAGNOSIS — Z0000 Encounter for general adult medical examination without abnormal findings: Secondary | ICD-10-CM

## 2020-02-13 DIAGNOSIS — E781 Pure hyperglyceridemia: Secondary | ICD-10-CM

## 2020-02-13 DIAGNOSIS — R7309 Other abnormal glucose: Secondary | ICD-10-CM

## 2020-02-13 MED ORDER — MONTELUKAST SODIUM 10 MG PO TABS: 10.0000 mg | ORAL_TABLET | Freq: Every day | ORAL | 2 refills | Status: DC

## 2020-02-13 NOTE — Telephone Encounter (Signed)
Aware of refill. New future labs ordered.

## 2020-02-13 NOTE — Telephone Encounter (Signed)
I will fill singulair.   Please add Vitamin D, Hep C & HIV. Thanks, WS

## 2020-02-13 NOTE — Telephone Encounter (Signed)
He was a Dr. Christell Constant patient and now will have visit with new provider.  Future blood work was ordered. Please review and make changes as desired.  Patient asks if you will refill his singulair to cover him until his visit?  Thanks

## 2020-02-20 ENCOUNTER — Other Ambulatory Visit: Payer: BC Managed Care – PPO

## 2020-02-20 ENCOUNTER — Other Ambulatory Visit: Payer: Self-pay | Admitting: Family Medicine

## 2020-02-20 ENCOUNTER — Other Ambulatory Visit: Payer: Self-pay

## 2020-02-20 DIAGNOSIS — Z0000 Encounter for general adult medical examination without abnormal findings: Secondary | ICD-10-CM | POA: Diagnosis not present

## 2020-02-20 DIAGNOSIS — E781 Pure hyperglyceridemia: Secondary | ICD-10-CM

## 2020-02-20 DIAGNOSIS — R7309 Other abnormal glucose: Secondary | ICD-10-CM

## 2020-02-20 LAB — URINALYSIS, COMPLETE
Bilirubin, UA: NEGATIVE
Glucose, UA: NEGATIVE
Ketones, UA: NEGATIVE
Leukocytes,UA: NEGATIVE
Nitrite, UA: NEGATIVE
Protein,UA: NEGATIVE
RBC, UA: NEGATIVE
Specific Gravity, UA: 1.015 (ref 1.005–1.030)
Urobilinogen, Ur: 0.2 mg/dL (ref 0.2–1.0)
pH, UA: 6 (ref 5.0–7.5)

## 2020-02-20 LAB — MICROSCOPIC EXAMINATION
Bacteria, UA: NONE SEEN
Epithelial Cells (non renal): NONE SEEN /hpf (ref 0–10)
RBC, Urine: NONE SEEN /hpf (ref 0–2)
Renal Epithel, UA: NONE SEEN /hpf
WBC, UA: NONE SEEN /hpf (ref 0–5)

## 2020-02-20 LAB — BAYER DCA HB A1C WAIVED: HB A1C (BAYER DCA - WAIVED): 6.1 % (ref <7.0)

## 2020-02-20 MED ORDER — MONTELUKAST SODIUM 10 MG PO TABS: 10.0000 mg | ORAL_TABLET | Freq: Every day | ORAL | 0 refills | Status: DC

## 2020-02-20 NOTE — Telephone Encounter (Signed)
  Prescription Request  02/20/2020  What is the name of the medication or equipment? Singular (Generic)  Have you contacted your pharmacy to request a refill? (if applicable) Yes  Which pharmacy would you like this sent to? [facility]   Patient notified that their request is being sent to the clinical staff for review and that they should receive a response within 2 business days.   Stacks' pt.  Pt has appt in 2 days w/St[facility] & he is out of his meds. Please send in.

## 2020-02-20 NOTE — Telephone Encounter (Signed)
Pt's LOV 12/12/18 Next OV 02/22/20 Needing refill on Singular

## 2020-02-21 LAB — CMP14+EGFR
ALT: 26 IU/L (ref 0–44)
AST: 24 IU/L (ref 0–40)
Albumin/Globulin Ratio: 1.7 (ref 1.2–2.2)
Albumin: 4.8 g/dL (ref 3.8–4.9)
Alkaline Phosphatase: 110 IU/L (ref 39–117)
BUN/Creatinine Ratio: 11 (ref 9–20)
BUN: 11 mg/dL (ref 6–24)
Bilirubin Total: 0.5 mg/dL (ref 0.0–1.2)
CO2: 24 mmol/L (ref 20–29)
Calcium: 9.6 mg/dL (ref 8.7–10.2)
Chloride: 101 mmol/L (ref 96–106)
Creatinine, Ser: 0.99 mg/dL (ref 0.76–1.27)
GFR calc Af Amer: 97 mL/min/{1.73_m2} (ref >59)
GFR calc non Af Amer: 84 mL/min/{1.73_m2} (ref >59)
Globulin, Total: 2.9 g/dL (ref 1.5–4.5)
Glucose: 100 mg/dL — ABNORMAL HIGH (ref 65–99)
Potassium: 4.5 mmol/L (ref 3.5–5.2)
Sodium: 140 mmol/L (ref 134–144)
Total Protein: 7.7 g/dL (ref 6.0–8.5)

## 2020-02-21 LAB — CBC WITH DIFFERENTIAL/PLATELET
Basophils Absolute: 0 10*3/uL (ref 0.0–0.2)
Basos: 0 %
EOS (ABSOLUTE): 0.1 10*3/uL (ref 0.0–0.4)
Eos: 1 %
Hematocrit: 49 % (ref 37.5–51.0)
Hemoglobin: 16.4 g/dL (ref 13.0–17.7)
Immature Grans (Abs): 0 10*3/uL (ref 0.0–0.1)
Immature Granulocytes: 0 %
Lymphocytes Absolute: 2.7 10*3/uL (ref 0.7–3.1)
Lymphs: 39 %
MCH: 30.7 pg (ref 26.6–33.0)
MCHC: 33.5 g/dL (ref 31.5–35.7)
MCV: 92 fL (ref 79–97)
Monocytes Absolute: 0.6 10*3/uL (ref 0.1–0.9)
Monocytes: 8 %
Neutrophils Absolute: 3.6 10*3/uL (ref 1.4–7.0)
Neutrophils: 52 %
Platelets: 233 10*3/uL (ref 150–450)
RBC: 5.34 x10E6/uL (ref 4.14–5.80)
RDW: 13.2 % (ref 11.6–15.4)
WBC: 7 10*3/uL (ref 3.4–10.8)

## 2020-02-21 LAB — LIPID PANEL
Chol/HDL Ratio: 6.1 ratio — ABNORMAL HIGH (ref 0.0–5.0)
Cholesterol, Total: 269 mg/dL — ABNORMAL HIGH (ref 100–199)
HDL: 44 mg/dL (ref >39)
LDL Chol Calc (NIH): 175 mg/dL — ABNORMAL HIGH (ref 0–99)
Triglycerides: 263 mg/dL — ABNORMAL HIGH (ref 0–149)
VLDL Cholesterol Cal: 50 mg/dL — ABNORMAL HIGH (ref 5–40)

## 2020-02-21 LAB — THYROID PANEL WITH TSH
Free Thyroxine Index: 2 (ref 1.2–4.9)
T3 Uptake Ratio: 26 % (ref 24–39)
T4, Total: 7.5 ug/dL (ref 4.5–12.0)
TSH: 4.38 u[IU]/mL (ref 0.450–4.500)

## 2020-02-21 LAB — VITAMIN D 25 HYDROXY (VIT D DEFICIENCY, FRACTURES): Vit D, 25-Hydroxy: 31.9 ng/mL (ref 30.0–100.0)

## 2020-02-21 LAB — PSA, TOTAL AND FREE
PSA, Free Pct: 28.8 %
PSA, Free: 0.23 ng/mL
Prostate Specific Ag, Serum: 0.8 ng/mL (ref 0.0–4.0)

## 2020-02-22 ENCOUNTER — Ambulatory Visit: Payer: 59 | Admitting: Family Medicine

## 2020-02-22 ENCOUNTER — Other Ambulatory Visit: Payer: Self-pay

## 2020-02-22 ENCOUNTER — Ambulatory Visit (INDEPENDENT_AMBULATORY_CARE_PROVIDER_SITE_OTHER): Payer: BC Managed Care – PPO | Admitting: Family Medicine

## 2020-02-22 ENCOUNTER — Encounter: Payer: Self-pay | Admitting: Family Medicine

## 2020-02-22 VITALS — BP 132/83 | HR 62 | Temp 97.7°F | Ht 68.0 in | Wt 181.0 lb

## 2020-02-22 DIAGNOSIS — F5101 Primary insomnia: Secondary | ICD-10-CM

## 2020-02-22 DIAGNOSIS — E782 Mixed hyperlipidemia: Secondary | ICD-10-CM

## 2020-02-22 DIAGNOSIS — N529 Male erectile dysfunction, unspecified: Secondary | ICD-10-CM

## 2020-02-22 DIAGNOSIS — Z0001 Encounter for general adult medical examination with abnormal findings: Secondary | ICD-10-CM

## 2020-02-22 DIAGNOSIS — Z0000 Encounter for general adult medical examination without abnormal findings: Secondary | ICD-10-CM

## 2020-02-22 DIAGNOSIS — R7303 Prediabetes: Secondary | ICD-10-CM

## 2020-02-22 DIAGNOSIS — G4733 Obstructive sleep apnea (adult) (pediatric): Secondary | ICD-10-CM | POA: Insufficient documentation

## 2020-02-22 DIAGNOSIS — M545 Low back pain, unspecified: Secondary | ICD-10-CM | POA: Insufficient documentation

## 2020-02-22 DIAGNOSIS — J302 Other seasonal allergic rhinitis: Secondary | ICD-10-CM

## 2020-02-22 DIAGNOSIS — G8929 Other chronic pain: Secondary | ICD-10-CM

## 2020-02-22 MED ORDER — MELOXICAM 15 MG PO TABS: 15.0000 mg | ORAL_TABLET | Freq: Every day | ORAL | 5 refills | Status: DC

## 2020-02-22 MED ORDER — MONTELUKAST SODIUM 10 MG PO TABS: 10.0000 mg | ORAL_TABLET | Freq: Every day | ORAL | 0 refills | Status: DC

## 2020-02-22 MED ORDER — ROSUVASTATIN CALCIUM 20 MG PO TABS: ORAL_TABLET | ORAL | 0 refills | Status: DC

## 2020-02-22 MED ORDER — TRAZODONE HCL 150 MG PO TABS: ORAL_TABLET | ORAL | 0 refills | Status: DC

## 2020-02-22 MED ORDER — SILDENAFIL CITRATE 20 MG PO TABS: ORAL_TABLET | ORAL | 1 refills | Status: DC

## 2020-02-22 NOTE — Progress Notes (Signed)
Subjective:  Patient ID: Darrell Schneider, male    DOB: 12-Sep-1961  Age: 59 y.o. MRN: [id number]  CC: Annual Exam   HPI Darrell Schneider presents for complete physical.  He ran out of his Crestor a few months ago.  He has problems with chronic back pain for which he has taken meloxicam in the past.  He has used sildenafil on occasion due to loss of erection prior to climax.  He has trouble staying asleep once he gets to sleep.  He was diagnosed in the remote past with sleep apnea.  Tonsillectomy and sinus surgery performed got rid of the sleep apnea however he has recently started back on a self titrating CPAP machine due to history of snoring and having apnea spells noted by his wife.  He does wake up refreshed in the morning.  However he has a tendency still to wake up through the night.  Depression screen [facility] 2/9 02/22/2020 12/12/2018 10/31/2018  Decreased Interest 0 0 0  Down, Depressed, Hopeless 0 0 0  PHQ - 2 Score 0 0 0    History Darrell Schneider has a past medical history of Other and unspecified hyperlipidemia and Seasonal allergies.   He has a past surgical history that includes Tonsillectomy; Vasectomy (1999); left great toe spur removal; Rotator cuff repair (Left, Dec 03 2013); and Sinus exploration.   His family history includes Dementia in his father; Hypertension in his mother.He reports that he has never smoked. He has never used smokeless tobacco. He reports current alcohol use. He reports that he does not use drugs.    ROS Review of Systems  Constitutional: Negative.  Negative for activity change, fatigue and unexpected weight change.  HENT: Negative.  Negative for congestion, ear pain, hearing loss, postnasal drip and trouble swallowing.   Eyes: Negative for pain and visual disturbance.  Respiratory: Negative for cough, chest tightness and shortness of breath.   Cardiovascular: Negative for chest pain, palpitations and leg swelling.  Gastrointestinal: Negative for abdominal distention,  abdominal pain, blood in stool, constipation, diarrhea, nausea and vomiting.  Endocrine: Negative for cold intolerance, heat intolerance and polydipsia.  Genitourinary: Negative for difficulty urinating, dysuria, flank pain, frequency and urgency.  Musculoskeletal: Positive for back pain (cronic recurring. Manages with meloxicam). Negative for arthralgias, joint swelling and myalgias.  Skin: Negative for color change, rash and wound.  Neurological: Negative for dizziness, syncope, speech difficulty, weakness, light-headedness, numbness and headaches.  Hematological: Does not bruise/bleed easily.  Psychiatric/Behavioral: Positive for sleep disturbance (awakens during the night, but can fall asleep at bedtime readily). Negative for confusion, decreased concentration and dysphoric mood. The patient is not nervous/anxious.     Objective:  BP 132/83   Pulse 62   Temp 97.7 F (36.5 C) (Temporal)   Ht '5\' 8"'$  (1.727 m)   Wt 181 lb (82.1 kg)   BMI 27.52 kg/m   BP Readings from Last 3 Encounters:  02/22/20 132/83  12/12/18 132/88  10/31/18 137/86    Wt Readings from Last 3 Encounters:  02/22/20 181 lb (82.1 kg)  12/12/18 178 lb 3.2 oz (80.8 kg)  10/31/18 183 lb (83 kg)     Physical Exam Constitutional:      Appearance: He is well-developed.  HENT:     Head: Normocephalic and atraumatic.  Eyes:     Pupils: Pupils are equal, round, and reactive to light.  Neck:     Thyroid: No thyromegaly.     Trachea: No tracheal deviation.  Cardiovascular:  Rate and Rhythm: Normal rate and regular rhythm.     Heart sounds: Normal heart sounds. No murmur. No friction rub. No gallop.   Pulmonary:     Breath sounds: Normal breath sounds. No wheezing or rales.  Abdominal:     General: Bowel sounds are normal. There is no distension.     Palpations: Abdomen is soft. There is no mass.     Tenderness: There is no abdominal tenderness.     Hernia: There is no hernia in the left inguinal area.    Genitourinary:    Penis: Normal.      Testes: Normal.  Musculoskeletal:        General: Normal range of motion.     Cervical back: Normal range of motion.  Lymphadenopathy:     Cervical: No cervical adenopathy.  Skin:    General: Skin is warm and dry.  Neurological:     Mental Status: He is alert and oriented to person, place, and time.    Results for orders placed or performed in visit on 02/20/20  Microscopic Examination   URINE  Result Value Ref Range   WBC, UA None seen 0 - 5 /hpf   RBC None seen 0 - 2 /hpf   Epithelial Cells (non renal) None seen 0 - 10 /hpf   Renal Epithel, UA None seen None seen /hpf   Bacteria, UA None seen None seen/Few  VITAMIN D 25 Hydroxy (Vit-D Deficiency, Fractures)  Result Value Ref Range   Vit D, 25-Hydroxy 31.9 30.0 - 100.0 ng/mL  PSA, total and free  Result Value Ref Range   Prostate Specific Ag, Serum 0.8 0.0 - 4.0 ng/mL   PSA, Free 0.23 N/A ng/mL   PSA, Free Pct 28.8 %  Bayer DCA Hb A1c Waived  Result Value Ref Range   HB A1C (BAYER DCA - WAIVED) 6.1 <7.0 %  Urinalysis, Complete  Result Value Ref Range   Specific Gravity, UA 1.015 1.005 - 1.030   pH, UA 6.0 5.0 - 7.5   Color, UA Yellow Yellow   Appearance Ur Clear Clear   Leukocytes,UA Negative Negative   Protein,UA Negative Negative/Trace   Glucose, UA Negative Negative   Ketones, UA Negative Negative   RBC, UA Negative Negative   Bilirubin, UA Negative Negative   Urobilinogen, Ur 0.2 0.2 - 1.0 mg/dL   Nitrite, UA Negative Negative   Microscopic Examination See below:   Thyroid Panel With TSH  Result Value Ref Range   TSH 4.380 0.450 - 4.500 uIU/mL   T4, Total 7.5 4.5 - 12.0 ug/dL   T3 Uptake Ratio 26 24 - 39 %   Free Thyroxine Index 2.0 1.2 - 4.9  CBC with Differential/Platelet  Result Value Ref Range   WBC 7.0 3.4 - 10.8 x10E3/uL   RBC 5.34 4.14 - 5.80 x10E6/uL   Hemoglobin 16.4 13.0 - 17.7 g/dL   Hematocrit 49.0 37.5 - 51.0 %   MCV 92 79 - 97 fL   MCH 30.7 26.6 -  33.0 pg   MCHC 33.5 31.5 - 35.7 g/dL   RDW 13.2 11.6 - 15.4 %   Platelets 233 150 - 450 x10E3/uL   Neutrophils 52 Not Estab. %   Lymphs 39 Not Estab. %   Monocytes 8 Not Estab. %   Eos 1 Not Estab. %   Basos 0 Not Estab. %   Neutrophils Absolute 3.6 1.4 - 7.0 x10E3/uL   Lymphocytes Absolute 2.7 0.7 - 3.1 x10E3/uL   Monocytes  Absolute 0.6 0.1 - 0.9 x10E3/uL   EOS (ABSOLUTE) 0.1 0.0 - 0.4 x10E3/uL   Basophils Absolute 0.0 0.0 - 0.2 x10E3/uL   Immature Granulocytes 0 Not Estab. %   Immature Grans (Abs) 0.0 0.0 - 0.1 x10E3/uL  Lipid panel  Result Value Ref Range   Cholesterol, Total 269 (H) 100 - 199 mg/dL   Triglycerides 263 (H) 0 - 149 mg/dL   HDL 44 >39 mg/dL   VLDL Cholesterol Cal 50 (H) 5 - 40 mg/dL   LDL Chol Calc (NIH) 175 (H) 0 - 99 mg/dL   Chol/HDL Ratio 6.1 (H) 0.0 - 5.0 ratio  CMP14+EGFR  Result Value Ref Range   Glucose 100 (H) 65 - 99 mg/dL   BUN 11 6 - 24 mg/dL   Creatinine, Ser 0.99 0.76 - 1.27 mg/dL   GFR calc non Af Amer 84 >59 mL/min/1.73   GFR calc Af Amer 97 >59 mL/min/1.73   BUN/Creatinine Ratio 11 9 - 20   Sodium 140 134 - 144 mmol/L   Potassium 4.5 3.5 - 5.2 mmol/L   Chloride 101 96 - 106 mmol/L   CO2 24 20 - 29 mmol/L   Calcium 9.6 8.7 - 10.2 mg/dL   Total Protein 7.7 6.0 - 8.5 g/dL   Albumin 4.8 3.8 - 4.9 g/dL   Globulin, Total 2.9 1.5 - 4.5 g/dL   Albumin/Globulin Ratio 1.7 1.2 - 2.2   Bilirubin Total 0.5 0.0 - 1.2 mg/dL   Alkaline Phosphatase 110 39 - 117 IU/L   AST 24 0 - 40 IU/L   ALT 26 0 - 44 IU/L       Assessment & Plan:   Darrell Schneider was seen today for annual exam.  Diagnoses and all orders for this visit:  Well adult exam  Mixed hyperlipidemia -     rosuvastatin (CRESTOR) 20 MG tablet; TAKE ONE TABLET BY MOUTH DAILY AS DIRECTED  Prediabetes  Erectile dysfunction, unspecified erectile dysfunction type -     sildenafil (REVATIO) 20 MG tablet; Take 2-5 tablets as needed for sexual activity  Chronic low back pain without sciatica,  unspecified back pain laterality -     meloxicam (MOBIC) 15 MG tablet; Take 1 tablet (15 mg total) by mouth daily. For joint and muscle pain  Primary insomnia -     traZODone (DESYREL) 150 MG tablet; Use from 1/3 to 1 tablet nightly as needed for sleep.  Obstructive sleep apnea syndrome  Seasonal allergies -     montelukast (SINGULAIR) 10 MG tablet; Take 1 tablet (10 mg total) by mouth at bedtime.    Although the patient is not obese he has developed prediabetes for an unknown reason.  His A1c is 6.0.  He should follow a carb controlled diet.  A suggested program was printed for him.  Although he is not obese weight loss of about 10 pounds may be useful in preventing progression to full diabetes.   I am having Darrell Schneider start on meloxicam and traZODone. I am also having him maintain his Vitamin D3, fluticasone, montelukast, rosuvastatin, and sildenafil.  Allergies as of 02/22/2020      Reactions   Codeine Nausea Only      Medication List       Accurate as of Feb 22, 2020 12:09 PM. If you have any questions, ask your nurse or doctor.        fluticasone 50 MCG/ACT nasal spray Commonly known as: FLONASE   meloxicam 15 MG tablet Commonly known as: MOBIC  Take 1 tablet (15 mg total) by mouth daily. For joint and muscle pain Started by: Claretta Fraise, MD   montelukast 10 MG tablet Commonly known as: SINGULAIR Take 1 tablet (10 mg total) by mouth at bedtime.   rosuvastatin 20 MG tablet Commonly known as: CRESTOR TAKE ONE TABLET BY MOUTH DAILY AS DIRECTED   sildenafil 20 MG tablet Commonly known as: REVATIO Take 2-5 tablets as needed for sexual activity   traZODone 150 MG tablet Commonly known as: DESYREL Use from 1/3 to 1 tablet nightly as needed for sleep. Started by: Claretta Fraise, MD   Vitamin D3 25 MCG (1000 UT) Caps Take 1 tablet by mouth 1 dose over 46 hours.        Follow-up: Return in about 1 year (around 02/21/2021).  Claretta Fraise, M.D.

## 2020-02-22 NOTE — Patient Instructions (Signed)
Carbohydrate Counting for Diabetes Mellitus, Adult  Carbohydrate counting is a method of keeping track of how many carbohydrates you eat. Eating carbohydrates naturally increases the amount of sugar (glucose) in the blood. Counting how many carbohydrates you eat helps keep your blood glucose within normal limits, which helps you manage your diabetes (diabetes mellitus). It is important to know how many carbohydrates you can safely have in each meal. This is different for every person. A diet and nutrition specialist (registered dietitian) can help you make a meal plan and calculate how many carbohydrates you should have at each meal and snack. Carbohydrates are found in the following foods:  Grains, such as breads and cereals.  Dried beans and soy products.  Starchy vegetables, such as potatoes, peas, and corn.  Fruit and fruit juices.  Milk and yogurt.  Sweets and snack foods, such as cake, cookies, candy, chips, and soft drinks. How do I count carbohydrates? There are two ways to count carbohydrates in food. You can use either of the methods or a combination of both. Reading "Nutrition Facts" on packaged food The "Nutrition Facts" list is included on the labels of almost all packaged foods and beverages in the [place] It includes:  The serving size.  Information about nutrients in each serving, including the grams (g) of carbohydrate per serving. To use the "Nutrition Facts":  Decide how many servings you will have.  Multiply the number of servings by the number of carbohydrates per serving.  The resulting number is the total amount of carbohydrates that you will be having. Learning standard serving sizes of other foods When you eat carbohydrate foods that are not packaged or do not include "Nutrition Facts" on the label, you need to measure the servings in order to count the amount of carbohydrates:  Measure the foods that you will eat with a food scale or measuring cup, if  needed.  Decide how many standard-size servings you will eat.  Multiply the number of servings by 15. Most carbohydrate-rich foods have about 15 g of carbohydrates per serving. ? For example, if you eat 8 oz (170 g) of strawberries, you will have eaten 2 servings and 30 g of carbohydrates (2 servings x 15 g = 30 g).  For foods that have more than one food mixed, such as soups and casseroles, you must count the carbohydrates in each food that is included. The following list contains standard serving sizes of common carbohydrate-rich foods. Each of these servings has about 15 g of carbohydrates:   hamburger bun or  English muffin.   oz (15 mL) syrup.   oz (14 g) jelly.  1 slice of bread.  1 six-inch tortilla.  3 oz (85 g) cooked rice or pasta.  4 oz (113 g) cooked dried beans.  4 oz (113 g) starchy vegetable, such as peas, corn, or potatoes.  4 oz (113 g) hot cereal.  4 oz (113 g) mashed potatoes or  of a large baked potato.  4 oz (113 g) canned or frozen fruit.  4 oz (120 mL) fruit juice.  4-6 crackers.  6 chicken nuggets.  6 oz (170 g) unsweetened dry cereal.  6 oz (170 g) plain fat-free yogurt or yogurt sweetened with artificial sweeteners.  8 oz (240 mL) milk.  8 oz (170 g) fresh fruit or one small piece of fruit.  24 oz (680 g) popped popcorn. Example of carbohydrate counting Sample meal  3 oz (85 g) chicken breast.  6 oz (170 g)   brown rice.  4 oz (113 g) corn.  8 oz (240 mL) milk.  8 oz (170 g) strawberries with sugar-free whipped topping. Carbohydrate calculation 1. Identify the foods that contain carbohydrates: ? Rice. ? Corn. ? Milk. ? Strawberries. 2. Calculate how many servings you have of each food: ? 2 servings rice. ? 1 serving corn. ? 1 serving milk. ? 1 serving strawberries. 3. Multiply each number of servings by 15 g: ? 2 servings rice x 15 g = 30 g. ? 1 serving corn x 15 g = 15 g. ? 1 serving milk x 15 g = 15 g. ? 1  serving strawberries x 15 g = 15 g. 4. Add together all of the amounts to find the total grams of carbohydrates eaten: ? 30 g + 15 g + 15 g + 15 g = 75 g of carbohydrates total. Summary  Carbohydrate counting is a method of keeping track of how many carbohydrates you eat.  Eating carbohydrates naturally increases the amount of sugar (glucose) in the blood.  Counting how many carbohydrates you eat helps keep your blood glucose within normal limits, which helps you manage your diabetes.  A diet and nutrition specialist (registered dietitian) can help you make a meal plan and calculate how many carbohydrates you should have at each meal and snack. This information is not intended to replace advice given to you by your health care provider. Make sure you discuss any questions you have with your health care provider. Document Revised: 04/29/2017 Document Reviewed: 03/18/2016 Elsevier Patient Education  2020 [employer] Inc.  

## 2020-03-04 ENCOUNTER — Ambulatory Visit: Payer: 59 | Admitting: Family

## 2021-02-24 ENCOUNTER — Encounter: Payer: Self-pay | Admitting: Family Medicine

## 2021-02-24 ENCOUNTER — Ambulatory Visit (INDEPENDENT_AMBULATORY_CARE_PROVIDER_SITE_OTHER): Payer: 59 | Admitting: Family Medicine

## 2021-02-24 ENCOUNTER — Other Ambulatory Visit: Payer: Self-pay

## 2021-02-24 VITALS — BP 126/81 | HR 64 | Temp 97.4°F | Ht 68.0 in | Wt 183.4 lb

## 2021-02-24 DIAGNOSIS — J302 Other seasonal allergic rhinitis: Secondary | ICD-10-CM

## 2021-02-24 DIAGNOSIS — Z125 Encounter for screening for malignant neoplasm of prostate: Secondary | ICD-10-CM

## 2021-02-24 DIAGNOSIS — N529 Male erectile dysfunction, unspecified: Secondary | ICD-10-CM | POA: Diagnosis not present

## 2021-02-24 DIAGNOSIS — E782 Mixed hyperlipidemia: Secondary | ICD-10-CM | POA: Diagnosis not present

## 2021-02-24 DIAGNOSIS — G8929 Other chronic pain: Secondary | ICD-10-CM

## 2021-02-24 DIAGNOSIS — Z0000 Encounter for general adult medical examination without abnormal findings: Secondary | ICD-10-CM

## 2021-02-24 DIAGNOSIS — M545 Low back pain, unspecified: Secondary | ICD-10-CM

## 2021-02-24 DIAGNOSIS — F5101 Primary insomnia: Secondary | ICD-10-CM

## 2021-02-24 DIAGNOSIS — Z0001 Encounter for general adult medical examination with abnormal findings: Secondary | ICD-10-CM

## 2021-02-24 DIAGNOSIS — Z1211 Encounter for screening for malignant neoplasm of colon: Secondary | ICD-10-CM

## 2021-02-24 DIAGNOSIS — E559 Vitamin D deficiency, unspecified: Secondary | ICD-10-CM

## 2021-02-24 LAB — URINALYSIS
Bilirubin, UA: NEGATIVE
Glucose, UA: NEGATIVE
Ketones, UA: NEGATIVE
Leukocytes,UA: NEGATIVE
Nitrite, UA: NEGATIVE
Protein,UA: NEGATIVE
RBC, UA: NEGATIVE
Specific Gravity, UA: 1.01 (ref 1.005–1.030)
Urobilinogen, Ur: 0.2 mg/dL (ref 0.2–1.0)
pH, UA: 7 (ref 5.0–7.5)

## 2021-02-24 MED ORDER — FEXOFENADINE-PSEUDOEPHED ER 180-240 MG PO TB24: 1.0000 | ORAL_TABLET | Freq: Every evening | ORAL | 0 refills | Status: DC

## 2021-02-24 MED ORDER — MELOXICAM 15 MG PO TABS: 15.0000 mg | ORAL_TABLET | Freq: Every day | ORAL | 5 refills | Status: DC

## 2021-02-24 MED ORDER — SILDENAFIL CITRATE 20 MG PO TABS: ORAL_TABLET | ORAL | 1 refills | Status: DC

## 2021-02-24 MED ORDER — ROSUVASTATIN CALCIUM 20 MG PO TABS: ORAL_TABLET | ORAL | 0 refills | Status: DC

## 2021-02-24 MED ORDER — MONTELUKAST SODIUM 10 MG PO TABS: 10.0000 mg | ORAL_TABLET | Freq: Every day | ORAL | 0 refills | Status: DC

## 2021-02-24 MED ORDER — TRAZODONE HCL 150 MG PO TABS: ORAL_TABLET | ORAL | 0 refills | Status: DC

## 2021-02-24 NOTE — Progress Notes (Signed)
Subjective:  Patient ID: Darrell Schneider, male    DOB: 1961/06/30  Age: 60 y.o. MRN: [id number]  CC: Annual Exam   HPI Octavion Mollenkopf presents for CPE  Depression screen [facility] 2/9 02/24/2021 02/22/2020 12/12/2018  Decreased Interest 0 0 0  Down, Depressed, Hopeless 0 0 0  PHQ - 2 Score 0 0 0    History Issaac has a past medical history of Other and unspecified hyperlipidemia and Seasonal allergies.   He has a past surgical history that includes Tonsillectomy; Vasectomy (1999); left great toe spur removal; Rotator cuff repair (Left, Dec 03 2013); and Sinus exploration.   His family history includes Dementia in his father; Hypertension in his mother.He reports that he has never smoked. He has never used smokeless tobacco. He reports current alcohol use. He reports that he does not use drugs.    ROS Review of Systems  Constitutional: Negative for activity change, fatigue and unexpected weight change.  HENT: Positive for congestion, postnasal drip, sneezing and sore throat (scratchy). Negative for ear pain, hearing loss and trouble swallowing.   Eyes: Negative for pain and visual disturbance.  Respiratory: Negative for cough, chest tightness and shortness of breath.   Cardiovascular: Negative for chest pain, palpitations and leg swelling.  Gastrointestinal: Negative for abdominal distention, abdominal pain, blood in stool, constipation, diarrhea, nausea and vomiting.  Endocrine: Negative for cold intolerance, heat intolerance and polydipsia.  Genitourinary: Negative for difficulty urinating, dysuria, flank pain, frequency and urgency.  Musculoskeletal: Negative for arthralgias and joint swelling.  Skin: Negative for color change, rash and wound.  Neurological: Negative for dizziness, syncope, speech difficulty, weakness, light-headedness, numbness and headaches.  Hematological: Does not bruise/bleed easily.  Psychiatric/Behavioral: Negative for confusion, decreased concentration, dysphoric mood and  sleep disturbance. The patient is not nervous/anxious.     Objective:  BP 126/81   Pulse 64   Temp (!) 97.4 F (36.3 C)   Ht $R'5\' 8"'Av$  (1.727 m)   Wt 183 lb 6.4 oz (83.2 kg)   SpO2 99%   BMI 27.89 kg/m   BP Readings from Last 3 Encounters:  02/24/21 126/81  02/22/20 132/83  12/12/18 132/88    Wt Readings from Last 3 Encounters:  02/24/21 183 lb 6.4 oz (83.2 kg)  02/22/20 181 lb (82.1 kg)  12/12/18 178 lb 3.2 oz (80.8 kg)     Physical Exam Constitutional:      Appearance: He is well-developed.  HENT:     Head: Normocephalic and atraumatic.     Nose: Congestion present.  Eyes:     Pupils: Pupils are equal, round, and reactive to light.  Neck:     Thyroid: No thyromegaly.     Trachea: No tracheal deviation.  Cardiovascular:     Rate and Rhythm: Normal rate and regular rhythm.     Heart sounds: Normal heart sounds. No murmur heard. No friction rub. No gallop.   Pulmonary:     Breath sounds: Normal breath sounds. No wheezing or rales.  Abdominal:     General: Bowel sounds are normal. There is no distension.     Palpations: Abdomen is soft. There is no mass.     Tenderness: There is no abdominal tenderness.     Hernia: There is no hernia in the left inguinal area.  Genitourinary:    Penis: Normal.      Testes: Normal.  Musculoskeletal:        General: Normal range of motion.     Cervical back: Normal range of  motion.  Lymphadenopathy:     Cervical: No cervical adenopathy.  Skin:    General: Skin is warm and dry.  Neurological:     Mental Status: He is alert and oriented to person, place, and time.       Assessment & Plan:   Hansford was seen today for annual exam.  Diagnoses and all orders for this visit:  Well adult exam -     rosuvastatin (CRESTOR) 20 MG tablet; TAKE ONE TABLET BY MOUTH DAILY AS DIRECTED -     sildenafil (REVATIO) 20 MG tablet; Take 2-5 tablets as needed for sexual activity -     montelukast (SINGULAIR) 10 MG tablet; Take 1 tablet (10 mg  total) by mouth at bedtime. -     meloxicam (MOBIC) 15 MG tablet; Take 1 tablet (15 mg total) by mouth daily. For joint and muscle pain -     traZODone (DESYREL) 150 MG tablet; Use from 1/3 to 1 tablet nightly as needed for sleep. -     Ambulatory referral to Gastroenterology -     CBC with Differential/Platelet -     CMP14+EGFR -     Lipid panel -     VITAMIN D 25 Hydroxy (Vit-D Deficiency, Fractures) -     Urinalysis -     PSA, total and free  Mixed hyperlipidemia -     rosuvastatin (CRESTOR) 20 MG tablet; TAKE ONE TABLET BY MOUTH DAILY AS DIRECTED -     CBC with Differential/Platelet -     CMP14+EGFR -     Lipid panel  Erectile dysfunction, unspecified erectile dysfunction type -     sildenafil (REVATIO) 20 MG tablet; Take 2-5 tablets as needed for sexual activity -     CBC with Differential/Platelet -     CMP14+EGFR  Seasonal allergies -     montelukast (SINGULAIR) 10 MG tablet; Take 1 tablet (10 mg total) by mouth at bedtime. -     CBC with Differential/Platelet -     CMP14+EGFR  Chronic low back pain without sciatica, unspecified back pain laterality -     meloxicam (MOBIC) 15 MG tablet; Take 1 tablet (15 mg total) by mouth daily. For joint and muscle pain -     CBC with Differential/Platelet -     CMP14+EGFR  Primary insomnia -     traZODone (DESYREL) 150 MG tablet; Use from 1/3 to 1 tablet nightly as needed for sleep. -     CBC with Differential/Platelet -     CMP14+EGFR  Screen for colon cancer -     Ambulatory referral to Gastroenterology  Screening for prostate cancer -     PSA, total and free  Vitamin D deficiency -     VITAMIN D 25 Hydroxy (Vit-D Deficiency, Fractures)  Other orders -     fexofenadine-pseudoephedrine (ALLEGRA-D 24) 180-240 MG 24 hr tablet; Take 1 tablet by mouth every evening. For allergy and congestion       I am having Majesty Lumsden start on fexofenadine-pseudoephedrine. I am also having him maintain his Vitamin D3, fluticasone,  rosuvastatin, sildenafil, montelukast, meloxicam, and traZODone.  Allergies as of 02/24/2021      Reactions   Codeine Nausea Only      Medication List       Accurate as of Feb 24, 2021  2:50 PM. If you have any questions, ask your nurse or doctor.        fexofenadine-pseudoephedrine 180-240 MG 24 hr tablet Commonly known as:  ALLEGRA-D 24 Take 1 tablet by mouth every evening. For allergy and congestion Started by: Claretta Fraise, MD   fluticasone 50 MCG/ACT nasal spray Commonly known as: FLONASE   meloxicam 15 MG tablet Commonly known as: MOBIC Take 1 tablet (15 mg total) by mouth daily. For joint and muscle pain   montelukast 10 MG tablet Commonly known as: SINGULAIR Take 1 tablet (10 mg total) by mouth at bedtime.   rosuvastatin 20 MG tablet Commonly known as: CRESTOR TAKE ONE TABLET BY MOUTH DAILY AS DIRECTED   sildenafil 20 MG tablet Commonly known as: REVATIO Take 2-5 tablets as needed for sexual activity   traZODone 150 MG tablet Commonly known as: DESYREL Use from 1/3 to 1 tablet nightly as needed for sleep.   Vitamin D3 25 MCG (1000 UT) Caps Take 1 tablet by mouth 1 day or 1 dose.        Follow-up: Return in about 1 year (around 02/24/2022) for [facility]ete physical.  Claretta Fraise, M.D.

## 2021-02-25 LAB — CBC WITH DIFFERENTIAL/PLATELET
Basophils Absolute: 0 10*3/uL (ref 0.0–0.2)
Basos: 0 %
EOS (ABSOLUTE): 0.1 10*3/uL (ref 0.0–0.4)
Eos: 1 %
Hematocrit: 48.5 % (ref 37.5–51.0)
Hemoglobin: 17 g/dL (ref 13.0–17.7)
Immature Grans (Abs): 0 10*3/uL (ref 0.0–0.1)
Immature Granulocytes: 0 %
Lymphocytes Absolute: 2.9 10*3/uL (ref 0.7–3.1)
Lymphs: 39 %
MCH: 32 pg (ref 26.6–33.0)
MCHC: 35.1 g/dL (ref 31.5–35.7)
MCV: 91 fL (ref 79–97)
Monocytes Absolute: 0.5 10*3/uL (ref 0.1–0.9)
Monocytes: 7 %
Neutrophils Absolute: 3.8 10*3/uL (ref 1.4–7.0)
Neutrophils: 53 %
Platelets: 235 10*3/uL (ref 150–450)
RBC: 5.31 x10E6/uL (ref 4.14–5.80)
RDW: 12.8 % (ref 11.6–15.4)
WBC: 7.3 10*3/uL (ref 3.4–10.8)

## 2021-02-25 LAB — CMP14+EGFR
ALT: 29 IU/L (ref 0–44)
AST: 26 IU/L (ref 0–40)
Albumin/Globulin Ratio: 2 (ref 1.2–2.2)
Albumin: 5.3 g/dL — ABNORMAL HIGH (ref 3.8–4.9)
Alkaline Phosphatase: 117 IU/L (ref 44–121)
BUN/Creatinine Ratio: 9 (ref 9–20)
BUN: 10 mg/dL (ref 6–24)
Bilirubin Total: 0.7 mg/dL (ref 0.0–1.2)
CO2: 23 mmol/L (ref 20–29)
Calcium: 10.1 mg/dL (ref 8.7–10.2)
Chloride: 100 mmol/L (ref 96–106)
Creatinine, Ser: 1.11 mg/dL (ref 0.76–1.27)
Globulin, Total: 2.6 g/dL (ref 1.5–4.5)
Glucose: 110 mg/dL — ABNORMAL HIGH (ref 65–99)
Potassium: 5 mmol/L (ref 3.5–5.2)
Sodium: 138 mmol/L (ref 134–144)
Total Protein: 7.9 g/dL (ref 6.0–8.5)
eGFR: 76 mL/min/{1.73_m2} (ref >59)

## 2021-02-25 LAB — LIPID PANEL
Chol/HDL Ratio: 3.2 ratio (ref 0.0–5.0)
Cholesterol, Total: 173 mg/dL (ref 100–199)
HDL: 54 mg/dL (ref >39)
LDL Chol Calc (NIH): 93 mg/dL (ref 0–99)
Triglycerides: 149 mg/dL (ref 0–149)
VLDL Cholesterol Cal: 26 mg/dL (ref 5–40)

## 2021-02-25 LAB — VITAMIN D 25 HYDROXY (VIT D DEFICIENCY, FRACTURES): Vit D, 25-Hydroxy: 35.9 ng/mL (ref 30.0–100.0)

## 2021-02-25 LAB — PSA, TOTAL AND FREE
PSA, Free Pct: 35 %
PSA, Free: 0.21 ng/mL
Prostate Specific Ag, Serum: 0.6 ng/mL (ref 0.0–4.0)

## 2021-02-25 NOTE — Progress Notes (Signed)
Hello Turon,  Your lab result is normal and/or stable.Some minor variations that are not significant are commonly marked abnormal, but do not represent any medical problem for you.  Best regards, Truong Delcastillo, M.D.

## 2022-02-03 ENCOUNTER — Other Ambulatory Visit: Payer: Self-pay | Admitting: Family Medicine

## 2022-02-03 DIAGNOSIS — E782 Mixed hyperlipidemia: Secondary | ICD-10-CM

## 2022-02-03 DIAGNOSIS — Z0000 Encounter for general adult medical examination without abnormal findings: Secondary | ICD-10-CM

## 2022-02-03 DIAGNOSIS — J302 Other seasonal allergic rhinitis: Secondary | ICD-10-CM

## 2022-02-05 ENCOUNTER — Other Ambulatory Visit: Payer: Self-pay | Admitting: *Deleted

## 2022-02-05 DIAGNOSIS — N529 Male erectile dysfunction, unspecified: Secondary | ICD-10-CM

## 2022-02-05 DIAGNOSIS — J302 Other seasonal allergic rhinitis: Secondary | ICD-10-CM

## 2022-02-05 DIAGNOSIS — E782 Mixed hyperlipidemia: Secondary | ICD-10-CM

## 2022-02-05 DIAGNOSIS — Z0000 Encounter for general adult medical examination without abnormal findings: Secondary | ICD-10-CM

## 2022-02-05 NOTE — Telephone Encounter (Signed)
Fax from [facility]  ?Note written: Patient requesting ?Montelukast 10 mg 360 day supply ?Rosuvastatin 20 mg 360 day supply ?Sildenafil 20 mg 100 tables ? ?Last OV 02/24/21 Next OV is 02/25/22 ?

## 2022-02-25 ENCOUNTER — Ambulatory Visit (INDEPENDENT_AMBULATORY_CARE_PROVIDER_SITE_OTHER): Payer: 59 | Admitting: Family Medicine

## 2022-02-25 ENCOUNTER — Encounter: Payer: Self-pay | Admitting: Family Medicine

## 2022-02-25 VITALS — BP 124/74 | HR 69 | Temp 97.3°F | Ht 68.0 in | Wt 184.8 lb

## 2022-02-25 DIAGNOSIS — Z0000 Encounter for general adult medical examination without abnormal findings: Secondary | ICD-10-CM | POA: Diagnosis not present

## 2022-02-25 DIAGNOSIS — G8929 Other chronic pain: Secondary | ICD-10-CM

## 2022-02-25 DIAGNOSIS — E782 Mixed hyperlipidemia: Secondary | ICD-10-CM

## 2022-02-25 DIAGNOSIS — Z0001 Encounter for general adult medical examination with abnormal findings: Secondary | ICD-10-CM | POA: Diagnosis not present

## 2022-02-25 DIAGNOSIS — Z125 Encounter for screening for malignant neoplasm of prostate: Secondary | ICD-10-CM

## 2022-02-25 DIAGNOSIS — E559 Vitamin D deficiency, unspecified: Secondary | ICD-10-CM

## 2022-02-25 DIAGNOSIS — R7303 Prediabetes: Secondary | ICD-10-CM

## 2022-02-25 DIAGNOSIS — N529 Male erectile dysfunction, unspecified: Secondary | ICD-10-CM | POA: Diagnosis not present

## 2022-02-25 DIAGNOSIS — M545 Low back pain, unspecified: Secondary | ICD-10-CM

## 2022-02-25 LAB — URINALYSIS
Bilirubin, UA: NEGATIVE
Glucose, UA: NEGATIVE
Ketones, UA: NEGATIVE
Leukocytes,UA: NEGATIVE
Nitrite, UA: NEGATIVE
Protein,UA: NEGATIVE
Specific Gravity, UA: 1.01 (ref 1.005–1.030)
Urobilinogen, Ur: 0.2 mg/dL (ref 0.2–1.0)
pH, UA: 7 (ref 5.0–7.5)

## 2022-02-25 LAB — BAYER DCA HB A1C WAIVED: HB A1C (BAYER DCA - WAIVED): 5.8 % — ABNORMAL HIGH (ref 4.8–5.6)

## 2022-02-25 MED ORDER — MELOXICAM 15 MG PO TABS: 15.0000 mg | ORAL_TABLET | Freq: Every day | ORAL | 5 refills | Status: DC

## 2022-02-25 MED ORDER — SILDENAFIL CITRATE 20 MG PO TABS: ORAL_TABLET | ORAL | 1 refills | Status: DC

## 2022-02-25 NOTE — Progress Notes (Signed)
? ?Subjective:  ?Patient ID: Darrell Schneider, male    DOB: [birth year]  Age: 61 y.o. MRN: [id number] ? ?CC: Annual Exam ? ? ?HPI ?Darrell Schneider presents for Annual Physical.  ? ?Singulair prevents sinus headache. Meloxicam relieves occasional back pain. ? ? in for follow-up of elevated cholesterol. Doing well without complaints on current medication. Denies side effects of statin including myalgia and arthralgia and nausea. Currently no chest pain, shortness of breath or other cardiovascular related symptoms noted. ? ?Aic borderline. Checking for prediabetes.  ? ? ? ?  02/25/2022  ?  9:12 AM 02/25/2022  ?  9:09 AM 02/24/2021  ?  9:15 AM  ?Depression screen PHQ 2/9  ?Decreased Interest 0 0 0  ?Down, Depressed, Hopeless 0 0 0  ?PHQ - 2 Score 0 0 0  ?Altered sleeping 1    ?Tired, decreased energy 0    ?Change in appetite 0    ?Feeling bad or failure about yourself  0    ?Trouble concentrating 0    ?Moving slowly or fidgety/restless 0    ?Suicidal thoughts 0    ?PHQ-9 Score 1    ?Difficult doing work/chores Not difficult at all    ? ? ?History ?Anikin has a past medical history of Other and unspecified hyperlipidemia and Seasonal allergies.  ? ?He has a past surgical history that includes Tonsillectomy; Vasectomy (1999); left great toe spur removal; Rotator cuff repair (Left, Dec 03 2013); and Sinus exploration.  ? ?His family history includes Dementia in his father; Hypertension in his mother.He reports that he has never smoked. He has never used smokeless tobacco. He reports current alcohol use. He reports that he does not use drugs. ? ? ? ?ROS ?Review of Systems  ?Constitutional:  Negative for activity change, fatigue, fever and unexpected weight change.  ?HENT:  Negative for congestion, ear pain, hearing loss, postnasal drip and trouble swallowing.   ?Eyes:  Negative for pain and visual disturbance.  ?Respiratory:  Negative for cough, chest tightness and shortness of breath.   ?Cardiovascular:  Negative for chest pain, palpitations  and leg swelling.  ?Gastrointestinal:  Negative for abdominal distention, abdominal pain, blood in stool, constipation, diarrhea, nausea and vomiting.  ?Endocrine: Negative for cold intolerance, heat intolerance and polydipsia.  ?Genitourinary:  Negative for difficulty urinating, dysuria, flank pain, frequency and urgency.  ?Musculoskeletal:  Negative for arthralgias and joint swelling.  ?Skin:  Negative for color change, rash and wound.  ?Neurological:  Negative for dizziness, syncope, speech difficulty, weakness, light-headedness, numbness and headaches.  ?Hematological:  Does not bruise/bleed easily.  ?Psychiatric/Behavioral:  Negative for confusion, decreased concentration, dysphoric mood and sleep disturbance. The patient is not nervous/anxious.   ? ?Objective:  ?BP 124/74   Pulse 69   Temp (!) 97.3 ?F (36.3 ?C)   Ht $R'5\' 8"'bc$  (1.727 m)   SpO2 98%   BMI 27.89 kg/m?  ? ?BP Readings from Last 3 Encounters:  ?02/25/22 124/74  ?02/24/21 126/81  ?02/22/20 132/83  ? ? ?Wt Readings from Last 3 Encounters:  ?02/24/21 183 lb 6.4 oz (83.2 kg)  ?02/22/20 181 lb (82.1 kg)  ?12/12/18 178 lb 3.2 oz (80.8 kg)  ? ? ? ?Physical Exam ?Constitutional:   ?   Appearance: He is well-developed.  ?HENT:  ?   Head: Normocephalic and atraumatic.  ?Eyes:  ?   Pupils: Pupils are equal, round, and reactive to light.  ?Neck:  ?   Thyroid: No thyromegaly.  ?   Trachea: No tracheal deviation.  ?  Cardiovascular:  ?   Rate and Rhythm: Normal rate and regular rhythm.  ?   Heart sounds: Normal heart sounds. No murmur heard. ?  No friction rub. No gallop.  ?Pulmonary:  ?   Breath sounds: Normal breath sounds. No wheezing or rales.  ?Abdominal:  ?   General: Bowel sounds are normal. There is no distension.  ?   Palpations: Abdomen is soft. There is no mass.  ?   Tenderness: There is no abdominal tenderness.  ?   Hernia: There is no hernia in the left inguinal area.  ?Genitourinary: ?   Penis: Normal.   ?   Testes: Normal.  ?Musculoskeletal:     ?    General: Normal range of motion.  ?   Cervical back: Normal range of motion.  ?Lymphadenopathy:  ?   Cervical: No cervical adenopathy.  ?Skin: ?   General: Skin is warm and dry.  ?Neurological:  ?   Mental Status: He is alert and oriented to person, place, and time.  ? ? ? ? ?Assessment & Plan:  ? ?Jere was seen today for annual exam. ? ?Diagnoses and all orders for this visit: ? ?Well adult exam ?-     Bayer DCA Hb A1c Waived ?-     CBC with Differential/Platelet ?-     CMP14+EGFR ?-     Lipid panel ?-     Urinalysis ?-     PSA, total and free ?-     VITAMIN D 25 Hydroxy (Vit-D Deficiency, Fractures) ?-     meloxicam (MOBIC) 15 MG tablet; Take 1 tablet (15 mg total) by mouth daily. For joint and muscle pain ?-     sildenafil (REVATIO) 20 MG tablet; Take 2-5 tablets as needed for sexual activity ? ?Mixed hyperlipidemia ?-     Lipid panel ? ?Screening for prostate cancer ?-     PSA, total and free ? ?Vitamin D deficiency ?-     VITAMIN D 25 Hydroxy (Vit-D Deficiency, Fractures) ? ?Prediabetes ?-     Bayer DCA Hb A1c Waived ?-     CBC with Differential/Platelet ?-     CMP14+EGFR ? ?Chronic low back pain without sciatica, unspecified back pain laterality ?-     meloxicam (MOBIC) 15 MG tablet; Take 1 tablet (15 mg total) by mouth daily. For joint and muscle pain ? ?Erectile dysfunction, unspecified erectile dysfunction type ?-     sildenafil (REVATIO) 20 MG tablet; Take 2-5 tablets as needed for sexual activity ? ? ?Carb counting handout given, reviewed. ? ? ? ?I have discontinued Dalphine Handing traZODone and fexofenadine-pseudoephedrine. I am also having him maintain his Vitamin D3, fluticasone, montelukast, rosuvastatin, meloxicam, and sildenafil. ? ?Allergies as of 02/25/2022   ? ?   Reactions  ? Codeine Nausea Only  ? ?  ? ?  ?Medication List  ?  ? ?  ? Accurate as of Feb 25, 2022 10:04 AM. If you have any questions, ask your nurse or doctor.  ?  ?  ? ?  ? ?STOP taking these medications   ? ?fexofenadine-pseudoephedrine  180-240 MG 24 hr tablet ?Commonly known as: ALLEGRA-D 24 ?Stopped by: Claretta Fraise, MD ?  ?traZODone 150 MG tablet ?Commonly known as: DESYREL ?Stopped by: Claretta Fraise, MD ?  ? ?  ? ?TAKE these medications   ? ?fluticasone 50 MCG/ACT nasal spray ?Commonly known as: FLONASE ?  ?meloxicam 15 MG tablet ?Commonly known as: MOBIC ?Take 1 tablet (15 mg total)  by mouth daily. For joint and muscle pain ?  ?montelukast 10 MG tablet ?Commonly known as: SINGULAIR ?Take 1 tablet (10 mg total) by mouth at bedtime. ?  ?rosuvastatin 20 MG tablet ?Commonly known as: CRESTOR ?TAKE ONE TABLET BY MOUTH DAILY AS DIRECTED ?  ?sildenafil 20 MG tablet ?Commonly known as: REVATIO ?Take 2-5 tablets as needed for sexual activity ?  ?Vitamin D3 25 MCG (1000 UT) Caps ?Take 1 tablet by mouth 1 day or 1 dose. ?  ? ?  ? ? ? ?Follow-up: Return in about 1 year (around 02/26/2023), or if symptoms worsen or fail to improve. ? ?Claretta Fraise, M.D. ?

## 2022-02-25 NOTE — Patient Instructions (Signed)

## 2022-02-26 LAB — CBC WITH DIFFERENTIAL/PLATELET
Basophils Absolute: 0.1 10*3/uL (ref 0.0–0.2)
Basos: 1 %
EOS (ABSOLUTE): 0.2 10*3/uL (ref 0.0–0.4)
Eos: 3 %
Hematocrit: 46.6 % (ref 37.5–51.0)
Hemoglobin: 16.1 g/dL (ref 13.0–17.7)
Immature Grans (Abs): 0 10*3/uL (ref 0.0–0.1)
Immature Granulocytes: 0 %
Lymphocytes Absolute: 2.7 10*3/uL (ref 0.7–3.1)
Lymphs: 44 %
MCH: 31.6 pg (ref 26.6–33.0)
MCHC: 34.5 g/dL (ref 31.5–35.7)
MCV: 91 fL (ref 79–97)
Monocytes Absolute: 0.5 10*3/uL (ref 0.1–0.9)
Monocytes: 8 %
Neutrophils Absolute: 2.8 10*3/uL (ref 1.4–7.0)
Neutrophils: 44 %
Platelets: 210 10*3/uL (ref 150–450)
RBC: 5.1 x10E6/uL (ref 4.14–5.80)
RDW: 12.6 % (ref 11.6–15.4)
WBC: 6.1 10*3/uL (ref 3.4–10.8)

## 2022-02-26 LAB — LIPID PANEL
Chol/HDL Ratio: 3.6 ratio (ref 0.0–5.0)
Cholesterol, Total: 175 mg/dL (ref 100–199)
HDL: 49 mg/dL (ref >39)
LDL Chol Calc (NIH): 100 mg/dL — ABNORMAL HIGH (ref 0–99)
Triglycerides: 148 mg/dL (ref 0–149)
VLDL Cholesterol Cal: 26 mg/dL (ref 5–40)

## 2022-02-26 LAB — CMP14+EGFR
ALT: 23 IU/L (ref 0–44)
AST: 25 IU/L (ref 0–40)
Albumin/Globulin Ratio: 1.6 (ref 1.2–2.2)
Albumin: 4.5 g/dL (ref 3.8–4.9)
Alkaline Phosphatase: 99 IU/L (ref 44–121)
BUN/Creatinine Ratio: 10 (ref 10–24)
BUN: 10 mg/dL (ref 8–27)
Bilirubin Total: 0.6 mg/dL (ref 0.0–1.2)
CO2: 23 mmol/L (ref 20–29)
Calcium: 9.3 mg/dL (ref 8.6–10.2)
Chloride: 101 mmol/L (ref 96–106)
Creatinine, Ser: 0.97 mg/dL (ref 0.76–1.27)
Globulin, Total: 2.8 g/dL (ref 1.5–4.5)
Glucose: 115 mg/dL — ABNORMAL HIGH (ref 70–99)
Potassium: 4.5 mmol/L (ref 3.5–5.2)
Sodium: 139 mmol/L (ref 134–144)
Total Protein: 7.3 g/dL (ref 6.0–8.5)
eGFR: 89 mL/min/{1.73_m2} (ref >59)

## 2022-02-26 LAB — PSA, TOTAL AND FREE
PSA, Free Pct: 32.9 %
PSA, Free: 0.23 ng/mL
Prostate Specific Ag, Serum: 0.7 ng/mL (ref 0.0–4.0)

## 2022-02-26 LAB — VITAMIN D 25 HYDROXY (VIT D DEFICIENCY, FRACTURES): Vit D, 25-Hydroxy: 43.7 ng/mL (ref 30.0–100.0)

## 2022-02-26 NOTE — Progress Notes (Signed)
Hello Reiner,  Your lab result is normal and/or stable.Some minor variations that are not significant are commonly marked abnormal, but do not represent any medical problem for you.  Best regards, Kambre Messner, M.D.

## 2022-12-10 DIAGNOSIS — E785 Hyperlipidemia, unspecified: Secondary | ICD-10-CM | POA: Diagnosis not present

## 2022-12-10 DIAGNOSIS — G8929 Other chronic pain: Secondary | ICD-10-CM | POA: Diagnosis not present

## 2022-12-10 DIAGNOSIS — Z8249 Family history of ischemic heart disease and other diseases of the circulatory system: Secondary | ICD-10-CM | POA: Diagnosis not present

## 2022-12-10 DIAGNOSIS — N529 Male erectile dysfunction, unspecified: Secondary | ICD-10-CM | POA: Diagnosis not present

## 2022-12-10 DIAGNOSIS — J309 Allergic rhinitis, unspecified: Secondary | ICD-10-CM | POA: Diagnosis not present

## 2022-12-10 DIAGNOSIS — R030 Elevated blood-pressure reading, without diagnosis of hypertension: Secondary | ICD-10-CM | POA: Diagnosis not present

## 2023-02-23 ENCOUNTER — Other Ambulatory Visit: Payer: Self-pay | Admitting: Family Medicine

## 2023-02-23 DIAGNOSIS — J302 Other seasonal allergic rhinitis: Secondary | ICD-10-CM

## 2023-02-23 DIAGNOSIS — E782 Mixed hyperlipidemia: Secondary | ICD-10-CM

## 2023-02-23 DIAGNOSIS — Z0000 Encounter for general adult medical examination without abnormal findings: Secondary | ICD-10-CM

## 2023-04-15 ENCOUNTER — Encounter: Payer: Self-pay | Admitting: Family Medicine

## 2023-04-15 ENCOUNTER — Ambulatory Visit (INDEPENDENT_AMBULATORY_CARE_PROVIDER_SITE_OTHER): Payer: 59 | Admitting: Family Medicine

## 2023-04-15 VITALS — BP 128/73 | HR 69 | Temp 98.2°F | Ht 68.0 in | Wt 185.2 lb

## 2023-04-15 DIAGNOSIS — Z23 Encounter for immunization: Secondary | ICD-10-CM

## 2023-04-15 DIAGNOSIS — Z125 Encounter for screening for malignant neoplasm of prostate: Secondary | ICD-10-CM

## 2023-04-15 DIAGNOSIS — Z0001 Encounter for general adult medical examination with abnormal findings: Secondary | ICD-10-CM | POA: Diagnosis not present

## 2023-04-15 DIAGNOSIS — R7303 Prediabetes: Secondary | ICD-10-CM

## 2023-04-15 DIAGNOSIS — Z0000 Encounter for general adult medical examination without abnormal findings: Secondary | ICD-10-CM | POA: Diagnosis not present

## 2023-04-15 DIAGNOSIS — M25511 Pain in right shoulder: Secondary | ICD-10-CM

## 2023-04-15 DIAGNOSIS — E559 Vitamin D deficiency, unspecified: Secondary | ICD-10-CM | POA: Diagnosis not present

## 2023-04-15 DIAGNOSIS — E782 Mixed hyperlipidemia: Secondary | ICD-10-CM | POA: Diagnosis not present

## 2023-04-15 LAB — URINALYSIS
Bilirubin, UA: NEGATIVE
Glucose, UA: NEGATIVE
Ketones, UA: NEGATIVE
Leukocytes,UA: NEGATIVE
Nitrite, UA: NEGATIVE
Protein,UA: NEGATIVE
Specific Gravity, UA: <1.005 — ABNORMAL LOW (ref 1.005–1.030)
Urobilinogen, Ur: 0.2 mg/dL (ref 0.2–1.0)
pH, UA: 6.5 (ref 5.0–7.5)

## 2023-04-15 LAB — BAYER DCA HB A1C WAIVED: HB A1C (BAYER DCA - WAIVED): 5.8 % — ABNORMAL HIGH (ref 4.8–5.6)

## 2023-04-15 LAB — LIPID PANEL
Cholesterol, Total: 187 mg/dL (ref 100–199)
HDL: 48 mg/dL (ref >39)

## 2023-04-15 MED ORDER — BETAMETHASONE SOD PHOS & ACET 6 (3-3) MG/ML IJ SUSP
6.0000 mg | Freq: Once | INTRAMUSCULAR | Status: AC
Start: 2023-04-15 — End: 2023-04-15
  Administered 2023-04-15: 6 mg via INTRAMUSCULAR

## 2023-04-15 MED ORDER — ROSUVASTATIN CALCIUM 20 MG PO TABS: ORAL_TABLET | ORAL | 3 refills | Status: DC

## 2023-04-15 NOTE — Addendum Note (Signed)
Addended by: Diamantina Monks on: 04/15/2023 04:39 PM   Modules accepted: Orders

## 2023-04-15 NOTE — Progress Notes (Addendum)
Subjective:  Patient ID: Darrell Schneider, male    DOB: 04-28-1961  Age: 62 y.o. MRN: [id number]  CC: Annual Exam   HPI Kaw Oscarson presents for CPE.   Right rotator cuff hurting. Playing golf had a swing that went wrong on the follow through.  Following low carb diet, exercising regularly. Hx of prediabetes     04/15/2023    8:08 AM 02/25/2022    9:12 AM 02/25/2022    9:09 AM  Depression screen PHQ 2/9  Decreased Interest 0 0 0  Down, Depressed, Hopeless 0 0 0  PHQ - 2 Score 0 0 0  Altered sleeping  1   Tired, decreased energy  0   Change in appetite  0   Feeling bad or failure about yourself   0   Trouble concentrating  0   Moving slowly or fidgety/restless  0   Suicidal thoughts  0   PHQ-9 Score  1   Difficult doing work/chores  Not difficult at all     History Zahi has a past medical history of Other and unspecified hyperlipidemia and Seasonal allergies.   He has a past surgical history that includes Tonsillectomy; Vasectomy (1999); left great toe spur removal; Rotator cuff repair (Left, Dec 03 2013); and Sinus exploration.   His family history includes Dementia in his father; Hypertension in his mother.He reports that he has never smoked. He has never used smokeless tobacco. He reports current alcohol use. He reports that he does not use drugs.    ROS Review of Systems  Constitutional:  Negative for activity change, fatigue and unexpected weight change.  HENT:  Negative for congestion, ear pain, hearing loss, postnasal drip and trouble swallowing.   Eyes:  Negative for pain and visual disturbance.  Respiratory:  Negative for cough, chest tightness and shortness of breath.   Cardiovascular:  Negative for chest pain, palpitations and leg swelling.  Gastrointestinal:  Negative for abdominal distention, abdominal pain, blood in stool, constipation, diarrhea, nausea and vomiting.  Endocrine: Negative for cold intolerance, heat intolerance and polydipsia.  Genitourinary:   Negative for difficulty urinating, dysuria, flank pain, frequency and urgency.  Musculoskeletal:  Positive for arthralgias (right shoulder - some improvement). Negative for joint swelling.  Skin:  Negative for color change, rash and wound.  Neurological:  Negative for dizziness, syncope, speech difficulty, weakness, light-headedness, numbness and headaches.  Hematological:  Does not bruise/bleed easily.  Psychiatric/Behavioral:  Negative for confusion, decreased concentration, dysphoric mood and sleep disturbance. The patient is not nervous/anxious.     Objective:  BP 128/73   Pulse 69   Temp 98.2 F (36.8 C)   Ht 5\' 8"  (1.727 m)   Wt 185 lb 3.2 oz (84 kg)   SpO2 98%   BMI 28.16 kg/m   BP Readings from Last 3 Encounters:  04/15/23 128/73  02/25/22 124/74  02/24/21 126/81    Wt Readings from Last 3 Encounters:  04/15/23 185 lb 3.2 oz (84 kg)  02/25/22 184 lb 12.8 oz (83.8 kg)  02/24/21 183 lb 6.4 oz (83.2 kg)     Physical Exam Constitutional:      Appearance: He is well-developed.  HENT:     Head: Normocephalic and atraumatic.  Eyes:     Pupils: Pupils are equal, round, and reactive to light.  Neck:     Thyroid: No thyromegaly.     Trachea: No tracheal deviation.  Cardiovascular:     Rate and Rhythm: Normal rate and regular rhythm.  Heart sounds: Normal heart sounds. No murmur heard.    No friction rub. No gallop.  Pulmonary:     Breath sounds: Normal breath sounds. No wheezing or rales.  Abdominal:     General: Bowel sounds are normal. There is no distension.     Palpations: Abdomen is soft. There is no mass.     Tenderness: There is no abdominal tenderness.     Hernia: There is no hernia in the left inguinal area.  Genitourinary:    Penis: Normal.      Testes: Normal.  Musculoskeletal:        General: Tenderness (right shoulder) present. Normal range of motion.     Cervical back: Normal range of motion.  Lymphadenopathy:     Cervical: No cervical  adenopathy.  Skin:    General: Skin is warm and dry.  Neurological:     Mental Status: He is alert and oriented to person, place, and time.       Assessment & Plan:   Mutaz was seen today for annual exam.  Diagnoses and all orders for this visit:  Well adult exam -     Bayer DCA Hb A1c Waived -     CBC with Differential/Platelet -     CMP14+EGFR -     Lipid panel -     VITAMIN D 25 Hydroxy (Vit-D Deficiency, Fractures) -     PSA, total and free -     Urinalysis -     rosuvastatin (CRESTOR) 20 MG tablet; TAKE 1 TABLET BY MOUTH EVERY DAY  Mixed hyperlipidemia -     Lipid panel -     rosuvastatin (CRESTOR) 20 MG tablet; TAKE 1 TABLET BY MOUTH EVERY DAY  Screening for prostate cancer -     PSA, total and free  Vitamin D deficiency -     VITAMIN D 25 Hydroxy (Vit-D Deficiency, Fractures)  Prediabetes -     Bayer DCA Hb A1c Waived  Acute pain of right shoulder -     betamethasone acetate-betamethasone sodium phosphate (CELESTONE) injection 6 mg  Need for Tdap vaccination -     Tdap vaccine greater than or equal to 7yo IM       I have discontinued Kekai Ratledge's fluticasone. I have also changed his rosuvastatin. Additionally, I am having him maintain his Vitamin D3, meloxicam, sildenafil, and montelukast. We administered betamethasone acetate-betamethasone sodium phosphate.  Allergies as of 04/15/2023       Reactions   Codeine Nausea Only        Medication List        Accurate as of April 15, 2023 11:59 PM. If you have any questions, ask your nurse or doctor.          STOP taking these medications    fluticasone 50 MCG/ACT nasal spray Commonly known as: FLONASE Stopped by: Mechele Claude, MD       TAKE these medications    meloxicam 15 MG tablet Commonly known as: MOBIC Take 1 tablet (15 mg total) by mouth daily. For joint and muscle pain   montelukast 10 MG tablet Commonly known as: SINGULAIR TAKE 1 TABLET BY MOUTH EVERY DAY AT BEDTIME    rosuvastatin 20 MG tablet Commonly known as: CRESTOR TAKE 1 TABLET BY MOUTH EVERY DAY What changed: additional instructions Changed by: Mechele Claude, MD   sildenafil 20 MG tablet Commonly known as: REVATIO Take 2-5 tablets as needed for sexual activity   Vitamin D3 25 MCG (1000 UT)  Caps Take 1 tablet by mouth 1 day or 1 dose.         Follow-up: Return in about 1 year (around 04/14/2024), or if symptoms worsen or fail to improve.  Mechele Claude, M.D.

## 2023-04-16 LAB — CMP14+EGFR
ALT: 32 IU/L (ref 0–44)
AST: 28 IU/L (ref 0–40)
Albumin: 4.7 g/dL (ref 3.9–4.9)
Alkaline Phosphatase: 95 IU/L (ref 44–121)
BUN/Creatinine Ratio: 13 (ref 10–24)
BUN: 14 mg/dL (ref 8–27)
Bilirubin Total: 0.7 mg/dL (ref 0.0–1.2)
CO2: 24 mmol/L (ref 20–29)
Calcium: 9.9 mg/dL (ref 8.6–10.2)
Chloride: 100 mmol/L (ref 96–106)
Creatinine, Ser: 1.07 mg/dL (ref 0.76–1.27)
Globulin, Total: 2.8 g/dL (ref 1.5–4.5)
Glucose: 119 mg/dL — ABNORMAL HIGH (ref 70–99)
Potassium: 4.6 mmol/L (ref 3.5–5.2)
Sodium: 138 mmol/L (ref 134–144)
Total Protein: 7.5 g/dL (ref 6.0–8.5)
eGFR: 78 mL/min/{1.73_m2} (ref >59)

## 2023-04-16 LAB — PSA, TOTAL AND FREE
PSA, Free Pct: 31.3 %
PSA, Free: 0.25 ng/mL
Prostate Specific Ag, Serum: 0.8 ng/mL (ref 0.0–4.0)

## 2023-04-16 LAB — CBC WITH DIFFERENTIAL/PLATELET
Basophils Absolute: 0 10*3/uL (ref 0.0–0.2)
Basos: 1 %
EOS (ABSOLUTE): 0.1 10*3/uL (ref 0.0–0.4)
Eos: 2 %
Hematocrit: 48.5 % (ref 37.5–51.0)
Hemoglobin: 16.3 g/dL (ref 13.0–17.7)
Immature Grans (Abs): 0 10*3/uL (ref 0.0–0.1)
Immature Granulocytes: 0 %
Lymphocytes Absolute: 2.9 10*3/uL (ref 0.7–3.1)
Lymphs: 39 %
MCH: 30.4 pg (ref 26.6–33.0)
MCHC: 33.6 g/dL (ref 31.5–35.7)
MCV: 91 fL (ref 79–97)
Monocytes Absolute: 0.6 10*3/uL (ref 0.1–0.9)
Monocytes: 7 %
Neutrophils Absolute: 3.8 10*3/uL (ref 1.4–7.0)
Neutrophils: 51 %
Platelets: 210 10*3/uL (ref 150–450)
RBC: 5.36 x10E6/uL (ref 4.14–5.80)
RDW: 12.6 % (ref 11.6–15.4)
WBC: 7.4 10*3/uL (ref 3.4–10.8)

## 2023-04-16 LAB — LIPID PANEL
Chol/HDL Ratio: 3.9 ratio (ref 0.0–5.0)
LDL Chol Calc (NIH): 110 mg/dL — ABNORMAL HIGH (ref 0–99)
Triglycerides: 166 mg/dL — ABNORMAL HIGH (ref 0–149)
VLDL Cholesterol Cal: 29 mg/dL (ref 5–40)

## 2023-04-16 LAB — VITAMIN D 25 HYDROXY (VIT D DEFICIENCY, FRACTURES): Vit D, 25-Hydroxy: 58.8 ng/mL (ref 30.0–100.0)

## 2023-04-17 DIAGNOSIS — Z6827 Body mass index (BMI) 27.0-27.9, adult: Secondary | ICD-10-CM | POA: Diagnosis not present

## 2023-04-17 DIAGNOSIS — L03012 Cellulitis of left finger: Secondary | ICD-10-CM | POA: Diagnosis not present

## 2023-04-19 ENCOUNTER — Ambulatory Visit (INDEPENDENT_AMBULATORY_CARE_PROVIDER_SITE_OTHER): Payer: 59

## 2023-04-19 DIAGNOSIS — M25511 Pain in right shoulder: Secondary | ICD-10-CM

## 2023-04-19 DIAGNOSIS — M25519 Pain in unspecified shoulder: Secondary | ICD-10-CM

## 2023-04-19 MED ORDER — BETAMETHASONE SOD PHOS & ACET 6 (3-3) MG/ML IJ SUSP
6.0000 mg | Freq: Once | INTRAMUSCULAR | Status: AC
Start: 2023-04-19 — End: 2023-04-19
  Administered 2023-04-19: 6 mg via INTRAMUSCULAR

## 2023-04-19 NOTE — Progress Notes (Signed)
Celestone injection given to right deltoid.  Patient tolerated well.

## 2023-04-24 DIAGNOSIS — Z6826 Body mass index (BMI) 26.0-26.9, adult: Secondary | ICD-10-CM | POA: Diagnosis not present

## 2023-04-24 DIAGNOSIS — L03012 Cellulitis of left finger: Secondary | ICD-10-CM | POA: Diagnosis not present

## 2023-12-28 ENCOUNTER — Other Ambulatory Visit: Payer: Self-pay | Admitting: Family Medicine

## 2023-12-28 DIAGNOSIS — Z0000 Encounter for general adult medical examination without abnormal findings: Secondary | ICD-10-CM

## 2023-12-28 DIAGNOSIS — N529 Male erectile dysfunction, unspecified: Secondary | ICD-10-CM

## 2023-12-29 NOTE — Telephone Encounter (Signed)
 Stacks NTBS for 6 mos FU from June NO RF sent to pharmacy

## 2023-12-29 NOTE — Addendum Note (Signed)
 Addended by: Julious Payer D on: 12/29/2023 08:21 AM   Modules accepted: Orders

## 2023-12-29 NOTE — Telephone Encounter (Signed)
 PT SCHEDULED for cpe 04/27/2024. Can he get a refill?

## 2023-12-29 NOTE — Telephone Encounter (Signed)
 Copied from CRM [id number]. Topic: Clinical - Prescription Issue >> Dec 29, 2023  3:35 PM Geroge Baseman wrote: Reason for CRM: Patient states he's waiting on a refill of sildenafil (REVATIO) 20 MG tablet, pharmacy does not have it. Would like Dr Darlyn Read to take a look

## 2023-12-30 MED ORDER — SILDENAFIL CITRATE 20 MG PO TABS
ORAL_TABLET | ORAL | 0 refills | Status: DC
Start: 2023-12-30 — End: 2024-04-27

## 2024-02-15 ENCOUNTER — Other Ambulatory Visit: Payer: Self-pay | Admitting: Family Medicine

## 2024-02-15 DIAGNOSIS — Z0000 Encounter for general adult medical examination without abnormal findings: Secondary | ICD-10-CM

## 2024-02-15 DIAGNOSIS — J302 Other seasonal allergic rhinitis: Secondary | ICD-10-CM

## 2024-02-15 DIAGNOSIS — E782 Mixed hyperlipidemia: Secondary | ICD-10-CM

## 2024-04-27 ENCOUNTER — Encounter: Payer: Self-pay | Admitting: Family Medicine

## 2024-04-27 ENCOUNTER — Ambulatory Visit (INDEPENDENT_AMBULATORY_CARE_PROVIDER_SITE_OTHER): Payer: Self-pay | Admitting: Family Medicine

## 2024-04-27 ENCOUNTER — Telehealth: Payer: Self-pay | Admitting: Family Medicine

## 2024-04-27 ENCOUNTER — Ambulatory Visit: Payer: Self-pay | Admitting: Family Medicine

## 2024-04-27 VITALS — BP 136/81 | HR 68 | Temp 97.4°F | Ht 68.0 in | Wt 181.0 lb

## 2024-04-27 DIAGNOSIS — E559 Vitamin D deficiency, unspecified: Secondary | ICD-10-CM

## 2024-04-27 DIAGNOSIS — Z0001 Encounter for general adult medical examination with abnormal findings: Secondary | ICD-10-CM | POA: Diagnosis not present

## 2024-04-27 DIAGNOSIS — H9325 Central auditory processing disorder: Secondary | ICD-10-CM

## 2024-04-27 DIAGNOSIS — M545 Low back pain, unspecified: Secondary | ICD-10-CM

## 2024-04-27 DIAGNOSIS — Z125 Encounter for screening for malignant neoplasm of prostate: Secondary | ICD-10-CM

## 2024-04-27 DIAGNOSIS — J302 Other seasonal allergic rhinitis: Secondary | ICD-10-CM

## 2024-04-27 DIAGNOSIS — G8929 Other chronic pain: Secondary | ICD-10-CM

## 2024-04-27 DIAGNOSIS — Z1211 Encounter for screening for malignant neoplasm of colon: Secondary | ICD-10-CM

## 2024-04-27 DIAGNOSIS — Z23 Encounter for immunization: Secondary | ICD-10-CM

## 2024-04-27 DIAGNOSIS — E782 Mixed hyperlipidemia: Secondary | ICD-10-CM | POA: Diagnosis not present

## 2024-04-27 DIAGNOSIS — N529 Male erectile dysfunction, unspecified: Secondary | ICD-10-CM

## 2024-04-27 DIAGNOSIS — R7303 Prediabetes: Secondary | ICD-10-CM | POA: Diagnosis not present

## 2024-04-27 DIAGNOSIS — H9313 Tinnitus, bilateral: Secondary | ICD-10-CM

## 2024-04-27 DIAGNOSIS — Z0000 Encounter for general adult medical examination without abnormal findings: Secondary | ICD-10-CM

## 2024-04-27 LAB — BAYER DCA HB A1C WAIVED: HB A1C (BAYER DCA - WAIVED): 6 % — ABNORMAL HIGH (ref 4.8–5.6)

## 2024-04-27 MED ORDER — MELOXICAM 15 MG PO TABS: 15.0000 mg | ORAL_TABLET | Freq: Every day | ORAL | 5 refills | Status: AC

## 2024-04-27 MED ORDER — MONTELUKAST SODIUM 10 MG PO TABS: 10.0000 mg | ORAL_TABLET | Freq: Every day | ORAL | 0 refills | Status: DC

## 2024-04-27 MED ORDER — SILDENAFIL CITRATE 20 MG PO TABS: ORAL_TABLET | ORAL | 0 refills | Status: AC

## 2024-04-27 MED ORDER — ROSUVASTATIN CALCIUM 20 MG PO TABS: 20.0000 mg | ORAL_TABLET | Freq: Every day | ORAL | 0 refills | Status: DC

## 2024-04-27 NOTE — Telephone Encounter (Signed)
 Told pt about balance. He said he thought they had taken it off last year. He said that day he only got one shot and they charged him for two shots

## 2024-04-27 NOTE — Patient Instructions (Signed)
 [facility]  [address] [place]

## 2024-04-27 NOTE — Progress Notes (Signed)
 Subjective:  Patient ID: Darrell Schneider, male    DOB: 1961-09-13  Age: 63 y.o. MRN: [id number]  CC: Annual Exam   HPI Darrell Schneider presents for annual exam.      04/15/2023    8:08 AM 02/25/2022    9:12 AM 02/25/2022    9:09 AM  Depression screen PHQ 2/9  Decreased Interest 0 0 0  Down, Depressed, Hopeless 0 0 0  PHQ - 2 Score 0 0 0  Altered sleeping  1   Tired, decreased energy  0   Change in appetite  0   Feeling bad or failure about yourself   0   Trouble concentrating  0   Moving slowly or fidgety/restless  0   Suicidal thoughts  0   PHQ-9 Score  1   Difficult doing work/chores  Not difficult at all     History Darrell Schneider has a past medical history of Other and unspecified hyperlipidemia and Seasonal allergies.   He has a past surgical history that includes Tonsillectomy; Vasectomy (1999); left great toe spur removal; Rotator cuff repair (Left, Dec 03 2013); and Sinus exploration.   His family history includes Dementia in his father; Hypertension in his mother.He reports that he has never smoked. He has never used smokeless tobacco. He reports current alcohol use. He reports that he does not use drugs.    ROS Review of Systems  Constitutional:  Negative for activity change, fatigue and unexpected weight change.  HENT:  Positive for tinnitus (chronic for at least a year, more noticeable recently). Negative for congestion, ear pain, hearing loss, postnasal drip and trouble swallowing.   Eyes:  Negative for pain and visual disturbance.  Respiratory:  Negative for cough, chest tightness and shortness of breath.   Cardiovascular:  Negative for chest pain, palpitations and leg swelling.  Gastrointestinal:  Negative for abdominal distention, abdominal pain, blood in stool, constipation, diarrhea, nausea and vomiting.  Endocrine: Negative for cold intolerance, heat intolerance and polydipsia.  Genitourinary:  Negative for difficulty urinating, dysuria, flank pain, frequency and urgency.   Musculoskeletal:  Negative for arthralgias and joint swelling.  Skin:  Negative for color change, rash and wound.  Neurological:  Negative for dizziness, syncope, speech difficulty, weakness, light-headedness, numbness and headaches.  Hematological:  Does not bruise/bleed easily.  Psychiatric/Behavioral:  Negative for confusion, decreased concentration, dysphoric mood and sleep disturbance. The patient is not nervous/anxious.     Objective:  BP 136/81   Pulse 68   Temp (!) 97.4 F (36.3 C)   Ht 5' 8 (1.727 m)   Wt 181 lb (82.1 kg)   SpO2 97%   BMI 27.52 kg/m   BP Readings from Last 3 Encounters:  04/27/24 136/81  04/15/23 128/73  02/25/22 124/74    Wt Readings from Last 3 Encounters:  04/27/24 181 lb (82.1 kg)  04/15/23 185 lb 3.2 oz (84 kg)  02/25/22 184 lb 12.8 oz (83.8 kg)     Physical Exam Constitutional:      Appearance: He is well-developed.  HENT:     Head: Normocephalic and atraumatic.  Eyes:     Pupils: Pupils are equal, round, and reactive to light.  Neck:     Thyroid : No thyromegaly.     Trachea: No tracheal deviation.  Cardiovascular:     Rate and Rhythm: Normal rate and regular rhythm.     Heart sounds: Normal heart sounds. No murmur heard.    No friction rub. No gallop.  Pulmonary:  Breath sounds: Normal breath sounds. No wheezing or rales.  Abdominal:     General: Bowel sounds are normal. There is no distension.     Palpations: Abdomen is soft. There is no mass.     Tenderness: There is no abdominal tenderness.     Hernia: There is no hernia in the left inguinal area.  Genitourinary:    Penis: Normal.      Testes: Normal.  Musculoskeletal:        General: Normal range of motion.     Cervical back: Normal range of motion.  Lymphadenopathy:     Cervical: No cervical adenopathy.  Skin:    General: Skin is warm and dry.  Neurological:     Mental Status: He is alert and oriented to person, place, and time.      Assessment & Plan:   Well adult exam -     Bayer DCA Hb A1c Waived -     CBC with Differential/Platelet -     CMP14+EGFR -     Lipid panel -     PSA, total and free -     Rosuvastatin  Calcium ; Take 1 tablet (20 mg total) by mouth daily.  Dispense: 90 tablet; Refill: 0 -     Montelukast  Sodium; Take 1 tablet (10 mg total) by mouth at bedtime.  Dispense: 90 tablet; Refill: 0 -     Sildenafil  Citrate; Take 2-5 tablets as needed for sexual activity  Dispense: 100 tablet; Refill: 0 -     Meloxicam ; Take 1 tablet (15 mg total) by mouth daily. For joint and muscle pain  Dispense: 30 tablet; Refill: 5  Mixed hyperlipidemia -     CMP14+EGFR -     Lipid panel -     Rosuvastatin  Calcium ; Take 1 tablet (20 mg total) by mouth daily.  Dispense: 90 tablet; Refill: 0  Vitamin D  deficiency -     VITAMIN D  25 Hydroxy (Vit-D Deficiency, Fractures)  Prediabetes -     Bayer DCA Hb A1c Waived  Screening for prostate cancer -     PSA, total and free  Seasonal allergies -     Montelukast  Sodium; Take 1 tablet (10 mg total) by mouth at bedtime.  Dispense: 90 tablet; Refill: 0  Erectile dysfunction, unspecified erectile dysfunction type -     Sildenafil  Citrate; Take 2-5 tablets as needed for sexual activity  Dispense: 100 tablet; Refill: 0  Chronic low back pain without sciatica, unspecified back pain laterality -     Meloxicam ; Take 1 tablet (15 mg total) by mouth daily. For joint and muscle pain  Dispense: 30 tablet; Refill: 5  Immunization due -     Varicella-zoster vaccine IM  Screen for colon cancer -     Cologuard  Auditory processing disorder, acquired  Tinnitus aurium, bilateral     Follow-up: No follow-ups on file.  Darrell Schneider, M.D.

## 2024-04-28 LAB — CBC WITH DIFFERENTIAL/PLATELET
Basophils Absolute: 0.1 x10E3/uL (ref 0.0–0.2)
Basos: 1 %
EOS (ABSOLUTE): 0.1 x10E3/uL (ref 0.0–0.4)
Eos: 1 %
Hematocrit: 52 % — ABNORMAL HIGH (ref 37.5–51.0)
Hemoglobin: 17.1 g/dL (ref 13.0–17.7)
Immature Grans (Abs): 0 x10E3/uL (ref 0.0–0.1)
Immature Granulocytes: 0 %
Lymphocytes Absolute: 2.8 x10E3/uL (ref 0.7–3.1)
Lymphs: 39 %
MCH: 30.9 pg (ref 26.6–33.0)
MCHC: 32.9 g/dL (ref 31.5–35.7)
MCV: 94 fL (ref 79–97)
Monocytes Absolute: 0.5 x10E3/uL (ref 0.1–0.9)
Monocytes: 7 %
Neutrophils Absolute: 3.7 x10E3/uL (ref 1.4–7.0)
Neutrophils: 52 %
Platelets: 222 x10E3/uL (ref 150–450)
RBC: 5.53 x10E6/uL (ref 4.14–5.80)
RDW: 13.2 % (ref 11.6–15.4)
WBC: 7.1 x10E3/uL (ref 3.4–10.8)

## 2024-04-28 LAB — CMP14+EGFR
ALT: 26 IU/L (ref 0–44)
AST: 28 IU/L (ref 0–40)
Albumin: 4.7 g/dL (ref 3.9–4.9)
Alkaline Phosphatase: 109 IU/L (ref 44–121)
BUN/Creatinine Ratio: 12 (ref 10–24)
BUN: 12 mg/dL (ref 8–27)
Bilirubin Total: 0.6 mg/dL (ref 0.0–1.2)
CO2: 21 mmol/L (ref 20–29)
Calcium: 10 mg/dL (ref 8.6–10.2)
Chloride: 102 mmol/L (ref 96–106)
Creatinine, Ser: 0.98 mg/dL (ref 0.76–1.27)
Globulin, Total: 3 g/dL (ref 1.5–4.5)
Glucose: 112 mg/dL — ABNORMAL HIGH (ref 70–99)
Potassium: 5.3 mmol/L — ABNORMAL HIGH (ref 3.5–5.2)
Sodium: 141 mmol/L (ref 134–144)
Total Protein: 7.7 g/dL (ref 6.0–8.5)
eGFR: 87 mL/min/1.73 (ref >59)

## 2024-04-28 LAB — LIPID PANEL
Chol/HDL Ratio: 3.8 ratio (ref 0.0–5.0)
Cholesterol, Total: 210 mg/dL — ABNORMAL HIGH (ref 100–199)
HDL: 55 mg/dL (ref >39)
LDL Chol Calc (NIH): 131 mg/dL — ABNORMAL HIGH (ref 0–99)
Triglycerides: 138 mg/dL (ref 0–149)
VLDL Cholesterol Cal: 24 mg/dL (ref 5–40)

## 2024-04-28 LAB — PSA, TOTAL AND FREE
PSA, Free Pct: 34.3 %
PSA, Free: 0.24 ng/mL
Prostate Specific Ag, Serum: 0.7 ng/mL (ref 0.0–4.0)

## 2024-04-28 LAB — VITAMIN D 25 HYDROXY (VIT D DEFICIENCY, FRACTURES): Vit D, 25-Hydroxy: 77 ng/mL (ref 30.0–100.0)

## 2024-05-02 ENCOUNTER — Telehealth: Payer: Self-pay

## 2024-05-02 ENCOUNTER — Other Ambulatory Visit: Payer: Self-pay | Admitting: Family Medicine

## 2024-05-02 DIAGNOSIS — Z0000 Encounter for general adult medical examination without abnormal findings: Secondary | ICD-10-CM

## 2024-05-02 DIAGNOSIS — J302 Other seasonal allergic rhinitis: Secondary | ICD-10-CM

## 2024-05-02 DIAGNOSIS — E782 Mixed hyperlipidemia: Secondary | ICD-10-CM

## 2024-05-02 MED ORDER — MONTELUKAST SODIUM 10 MG PO TABS: 10.0000 mg | ORAL_TABLET | Freq: Every day | ORAL | 0 refills | Status: AC

## 2024-05-02 MED ORDER — ROSUVASTATIN CALCIUM 20 MG PO TABS: 20.0000 mg | ORAL_TABLET | Freq: Every day | ORAL | 0 refills | Status: AC

## 2024-05-02 NOTE — Telephone Encounter (Signed)
 Copied from CRM [id number]. Topic: Clinical - Prescription Issue >> May 02, 2024 12:40 PM DeAngela L wrote: Reason for CRM: pt states when the refills montelukast  and also rosuvastatin  was sent to the pharm but it was not correct, Dr Zollie usually send 360 pills for each refill and this would be for the whole year, Patient states the pharmacy received 1 prescription for both for 90 days this has caused the patient to lose his yearly discount with the pharmacy. So the pharmacy tried to send a message to the office about this needing to be rewritten for a year so the patient can save on the prescription  montelukast  (SINGULAIR ) 10 MG tablet rosuvastatin  (CRESTOR ) 20 MG tablet  [facility] [place], [place] - [address] [place] [address] Phone: [phone] Fax: [phone]    Pt num [phone] (M)

## 2024-05-02 NOTE — Telephone Encounter (Signed)
 Please let the patient know that I sent their prescription to their pharmacy. Thanks, WS

## 2024-05-03 ENCOUNTER — Telehealth: Payer: Self-pay | Admitting: Family Medicine

## 2024-05-03 NOTE — Telephone Encounter (Signed)
 Spoke with patient regarding the bill.  Patient states he did not get the cortisone shot on 04/15/23 - but he is still getting billed for this.  Had to return on 04/19/23 to get the shot.  Has previously called about this bill to have it adjusted, but still getting the bill for it.  I will forward to billing to see if they can get it adjusted.  Copied from CRM [id number]. Topic: General - Billing Inquiry >> May 02, 2024 12:38 PM DeAngela L wrote: Reason for CRM: pt calling about a billing department  About a bill from 2024 and would like speak and try to get it resolved   Pt num [phone] (M)

## 2024-05-03 NOTE — Telephone Encounter (Signed)
Pt notified.    LS

## 2024-05-16 LAB — COLOGUARD: COLOGUARD: NEGATIVE

## 2024-05-23 NOTE — Telephone Encounter (Signed)
 Patient aware and verbalized understanding.

## 2024-08-11 ENCOUNTER — Encounter: Payer: Self-pay | Admitting: *Deleted

## 2024-10-30 ENCOUNTER — Ambulatory Visit: Payer: Self-pay | Admitting: Family Medicine

## 2024-11-30 ENCOUNTER — Ambulatory Visit: Payer: Self-pay | Admitting: Family Medicine

## 2025-05-01 ENCOUNTER — Encounter: Payer: Self-pay | Admitting: Family Medicine
